# Patient Record
Sex: Female | Born: 1970 | Race: White | Hispanic: Yes | Marital: Single | Smoking: Never smoker
Health system: Southern US, Community
[De-identification: ages and names within clinical notes are randomized; demographics above are authoritative.]

## PROBLEM LIST (undated history)

## (undated) DIAGNOSIS — Z789 Other specified health status: Secondary | ICD-10-CM

## (undated) DIAGNOSIS — E785 Hyperlipidemia, unspecified: Secondary | ICD-10-CM

## (undated) HISTORY — DX: Hyperlipidemia, unspecified: E78.5

## (undated) HISTORY — PX: BREAST ENHANCEMENT SURGERY: SHX7

---

## 2006-10-05 ENCOUNTER — Ambulatory Visit: Payer: Self-pay | Admitting: Internal Medicine

## 2006-10-10 ENCOUNTER — Ambulatory Visit: Payer: Self-pay | Admitting: *Deleted

## 2007-02-28 DIAGNOSIS — R7309 Other abnormal glucose: Secondary | ICD-10-CM | POA: Insufficient documentation

## 2007-08-07 ENCOUNTER — Encounter: Payer: Self-pay | Admitting: Family Medicine

## 2007-08-07 ENCOUNTER — Ambulatory Visit: Payer: Self-pay | Admitting: Internal Medicine

## 2007-09-25 ENCOUNTER — Ambulatory Visit: Payer: Self-pay | Admitting: Internal Medicine

## 2007-09-25 ENCOUNTER — Encounter: Payer: Self-pay | Admitting: Family Medicine

## 2007-09-25 LAB — CONVERTED CEMR LAB
AST: 13 units/L (ref 0–37)
Albumin: 4.6 g/dL (ref 3.5–5.2)
BUN: 11 mg/dL (ref 6–23)
Basophils Relative: 0 % (ref 0–1)
Calcium: 9.4 mg/dL (ref 8.4–10.5)
Chloride: 107 meq/L (ref 96–112)
Creatinine, Ser: 0.61 mg/dL (ref 0.40–1.20)
Eosinophils Absolute: 0.1 10*3/uL (ref 0.0–0.7)
Glucose, Bld: 90 mg/dL (ref 70–99)
HDL: 47 mg/dL (ref >39)
Hemoglobin: 13.9 g/dL (ref 12.0–15.0)
Lymphs Abs: 2.3 10*3/uL (ref 0.7–4.0)
MCHC: 32.3 g/dL (ref 30.0–36.0)
MCV: 93.5 fL (ref 78.0–100.0)
Monocytes Absolute: 0.3 10*3/uL (ref 0.1–1.0)
Monocytes Relative: 4 % (ref 3–12)
Neutro Abs: 5 10*3/uL (ref 1.7–7.7)
RBC: 4.61 M/uL (ref 3.87–5.11)
TSH: 2.002 microintl units/mL (ref 0.350–5.50)
Total CHOL/HDL Ratio: 3.3
Triglycerides: 100 mg/dL (ref <150)
WBC: 7.6 10*3/uL (ref 4.0–10.5)

## 2008-05-24 ENCOUNTER — Ambulatory Visit: Payer: Self-pay | Admitting: Internal Medicine

## 2008-05-31 ENCOUNTER — Ambulatory Visit (HOSPITAL_COMMUNITY): Admission: RE | Admit: 2008-05-31 | Discharge: 2008-05-31 | Payer: Self-pay | Admitting: Family Medicine

## 2008-08-23 ENCOUNTER — Ambulatory Visit: Payer: Self-pay | Admitting: Internal Medicine

## 2009-01-17 ENCOUNTER — Encounter: Payer: Self-pay | Admitting: Family Medicine

## 2009-01-17 ENCOUNTER — Ambulatory Visit: Payer: Self-pay | Admitting: Internal Medicine

## 2009-01-17 LAB — CONVERTED CEMR LAB: GC Probe Amp, Genital: NEGATIVE

## 2009-04-26 HISTORY — PX: AUGMENTATION MAMMAPLASTY: SUR837

## 2011-07-24 ENCOUNTER — Inpatient Hospital Stay (HOSPITAL_COMMUNITY): Payer: Medicaid Other

## 2011-07-24 ENCOUNTER — Encounter (HOSPITAL_COMMUNITY): Payer: Self-pay | Admitting: *Deleted

## 2011-07-24 ENCOUNTER — Inpatient Hospital Stay (HOSPITAL_COMMUNITY)
Admission: AD | Admit: 2011-07-24 | Discharge: 2011-07-25 | Disposition: A | Discharge status: Home / Self Care | Payer: Medicaid Other | Source: Ambulatory Visit | Attending: Obstetrics & Gynecology | Admitting: Obstetrics & Gynecology

## 2011-07-24 DIAGNOSIS — O00109 Unspecified tubal pregnancy without intrauterine pregnancy: Secondary | ICD-10-CM | POA: Insufficient documentation

## 2011-07-24 DIAGNOSIS — O009 Unspecified ectopic pregnancy without intrauterine pregnancy: Secondary | ICD-10-CM

## 2011-07-24 HISTORY — DX: Other specified health status: Z78.9

## 2011-07-24 LAB — CREATININE, SERUM: Creatinine, Ser: 0.75 mg/dL (ref 0.50–1.10)

## 2011-07-24 LAB — CBC
Hemoglobin: 12.5 g/dL (ref 12.0–15.0)
MCHC: 34.2 g/dL (ref 30.0–36.0)
Platelets: 274 10*3/uL (ref 150–400)
RBC: 4.02 MIL/uL (ref 3.87–5.11)

## 2011-07-24 LAB — URINALYSIS, ROUTINE W REFLEX MICROSCOPIC
Bilirubin Urine: NEGATIVE
Glucose, UA: NEGATIVE mg/dL
Ketones, ur: NEGATIVE mg/dL
Specific Gravity, Urine: 1.015 (ref 1.005–1.030)
pH: 6 (ref 5.0–8.0)

## 2011-07-24 LAB — ABO/RH: ABO/RH(D): O POS

## 2011-07-24 LAB — BUN: BUN: 13 mg/dL (ref 6–23)

## 2011-07-24 LAB — URINE MICROSCOPIC-ADD ON

## 2011-07-24 LAB — HCG, QUANTITATIVE, PREGNANCY: hCG, Beta Chain, Quant, S: 985 m[IU]/mL — ABNORMAL HIGH (ref <5)

## 2011-07-24 MED ORDER — METHOTREXATE INJECTION FOR WOMEN'S HOSPITAL
50.0000 mg/m2 | Freq: Once | INTRAMUSCULAR | Status: AC
  Administered 2011-07-25: 90 mg via INTRAMUSCULAR
  Filled 2011-07-24: qty 1.8

## 2011-07-24 NOTE — MAU Provider Note (Signed)
History     CSN: [id number]  Arrival date and time: 07/24/11 2017   None     Chief Complaint  Patient presents with  . Vaginal Bleeding  . Back Pain  . Abdominal Pain   HPI This is a 41 year old G5 P2021 at 5 weeks and 5 days who is seen with lower abdominal cramping that started earlier today. She denies vaginal bleeding, vaginal discharge, fevers, chills, nausea.  OB History    Grav Para Term Preterm Abortions TAB SAB Ect Mult Living   5 2 2  2  2   1       Past Medical History  Diagnosis Date  . No pertinent past medical history     Past Surgical History  Procedure Date  . Breast enhancement surgery     Family History  Problem Relation Age of Onset  . Anesthesia problems Neg Hx     History  Substance Use Topics  . Smoking status: Never Smoker   . Smokeless tobacco: Not on file  . Alcohol Use: No    Allergies: No Known Allergies  Prescriptions prior to admission  Medication Sig Dispense Refill  . acetaminophen (TYLENOL) 500 MG tablet Take 500 mg by mouth every 6 (six) hours as needed. For pain        Review of Systems  All other systems reviewed and are negative.   Physical Exam   Blood pressure 112/68, pulse 92, temperature 98.8 F (37.1 C), temperature source Oral, resp. rate 16, height 5' 3.75" (1.619 m), weight 71.215 kg (157 lb), last menstrual period 06/14/2011.  Physical Exam  Constitutional: She is oriented to person, place, and time. She appears well-developed and well-nourished.  HENT:  Head: Normocephalic and atraumatic.  Cardiovascular: Normal rate.   Respiratory: Effort normal.  GI: Soft. Bowel sounds are normal. She exhibits no distension and no mass. There is tenderness (Mild lower abdominal tenderness). There is no rebound and no guarding.  Musculoskeletal: Normal range of motion.  Neurological: She is alert and oriented to person, place, and time.  Skin: Skin is warm and dry.  Psychiatric: She has a normal mood and affect. Her  behavior is normal. Judgment and thought content normal.   Results for orders placed during the hospital encounter of 07/24/11 (from the past 24 hour(s))  URINALYSIS, ROUTINE W REFLEX MICROSCOPIC     Status: Abnormal   Collection Time   07/24/11  8:38 PM      Component Value Range   Color, Urine YELLOW  YELLOW    APPearance CLEAR  CLEAR    Specific Gravity, Urine 1.015  1.005 - 1.030    pH 6.0  5.0 - 8.0    Glucose, UA NEGATIVE  NEGATIVE (mg/dL)   Hgb urine dipstick TRACE (*) NEGATIVE    Bilirubin Urine NEGATIVE  NEGATIVE    Ketones, ur NEGATIVE  NEGATIVE (mg/dL)   Protein, ur NEGATIVE  NEGATIVE (mg/dL)   Urobilinogen, UA 0.2  0.0 - 1.0 (mg/dL)   Nitrite NEGATIVE  NEGATIVE    Leukocytes, UA NEGATIVE  NEGATIVE   URINE MICROSCOPIC-ADD ON     Status: Normal   Collection Time   07/24/11  8:38 PM      Component Value Range   Squamous Epithelial / LPF RARE  RARE    WBC, UA 0-2  <3 (WBC/hpf)   RBC / HPF 0-2  <3 (RBC/hpf)  POCT PREGNANCY, URINE     Status: Abnormal   Collection Time   07/24/11  8:45  PM      Component Value Range   Preg Test, Ur POSITIVE (*) NEGATIVE   CBC     Status: Normal   Collection Time   07/24/11  9:55 PM      Component Value Range   WBC 9.8  4.0 - 10.5 (K/uL)   RBC 4.02  3.87 - 5.11 (MIL/uL)   Hemoglobin 12.5  12.0 - 15.0 (g/dL)   HCT 16.1  09.6 - 04.5 (%)   MCV 91.0  78.0 - 100.0 (fL)   MCH 31.1  26.0 - 34.0 (pg)   MCHC 34.2  30.0 - 36.0 (g/dL)   RDW 40.9  81.1 - 91.4 (%)   Platelets 274  150 - 400 (K/uL)  ABO/RH     Status: Normal   Collection Time   07/24/11  9:55 PM      Component Value Range   ABO/RH(D) O POS    HCG, QUANTITATIVE, PREGNANCY     Status: Abnormal   Collection Time   07/24/11  9:55 PM      Component Value Range   hCG, Beta Chain, Quant, S 985 (*) <5 (mIU/mL)  AST     Status: Normal   Collection Time   07/24/11  9:55 PM      Component Value Range   AST 13  0 - 37 (U/L)  BUN     Status: Normal   Collection Time   07/24/11  9:55 PM        Component Value Range   BUN 13  6 - 23 (mg/dL)  CREATININE, SERUM     Status: Normal   Collection Time   07/24/11  9:55 PM      Component Value Range   Creatinine, Ser 0.75  0.50 - 1.10 (mg/dL)   GFR calc non Af Amer >90  >90 (mL/min)   GFR calc Af Amer >90  >90 (mL/min)   [place]: Findings: No intrauterine pregnancy is visualized. Multiple  uterine fibroids are noted., the largest measures up to 2.7 cm.  Endometrial thickness 11 mm.  There is a right adnexal mass adjacent to the right ovary measuring  1.5 x 1.0 x 1.0 cm. It has a yolk sac is seen within this cystic  mass. Findings compatible with right adnexal ectopic pregnancy.  No free fluid. Ovaries are symmetric in size and echotexture.  IMPRESSION:  1.5 cm right adnexal ectopic pregnancy. No evidence of rupture.  Multiple uterine fibroids.  MAU Course  Procedures  MDM  Assessment and Plan  1.  Ectopic Pregnancy Will administer methotrexate.  Patient to return in 4 days to MAU for repeat testing.  Naythan Douthit JEHIEL 07/24/2011, 11:51 PM

## 2011-07-24 NOTE — MAU Note (Signed)
Pt reports spotting, abdominal pain and bilateral back pain

## 2011-07-24 NOTE — Discharge Instructions (Signed)
Tratamiento con metotrexato para el embarazo ectpico (Methotrexate Treatment for an Ectopic Pregnancy) El embarazo ectpico se produce cuando el huevo fertilizado se adhiere (implanta) fuera del tero. La mayora de los embarazos ectpicos se producen en la trompa de Falopio. Es raro que ocurran en el ovario, el intestino, la pelvis o el cuello uterino. Un embarazo ectpico no tiene la capacidad de llegar a ser un beb normal y sano. Puede ser una experiencia que pone en peligro la vida. Sin embargo, si se diagnostica precozmente, puede tratarse con un medicamento. Este medicamento es el metotrexato. El metotrexato acta eficientemente impidiendo que el embarazo siga. Ayuda al organismo a absorber el tejido del embarazo en un perodo de 2 a 6 semanas (aunque la mayora de los embarazos sern absorbidos aproximadamente a las 3 semanas).  Si el metotrexato es efectivo, hay una buena probabilidad de que la trompa de Falopio puede salvarse. Independientemente de si la trompa de Falopio puede salvarse, una madre que ha tenido un embarazo ectpico tiene un riesgo mucho mayor de tener otro embarazo ectpico en los embarazos futuros. Una seria preocupacin es el desgarro potencial de la trompa de Falopio (ruptura). Si esto ocurre, es necesario realizar una ciruga de emergencia para remover el embarazo, y el metotrexato no se puede utilizar.  La paciente ideal para recibir el metotrexato es aquella que:   No sufra una hemorragia interna.   No sienta dolor abdominal intenso o persistente.   Se haya comprometido a seguir adelante con las pruebas de laboratorio y concurrir a los controles hasta que el embarazo ectpico se haya absorbido.   Sea sana y tenga las funciones hepticas y renales normales en el momento de la evaluacin.  El metotrexato no debe indicarse en mujeres que:   Estn amamantando.   Tener un embarazo normal (embarazo intrauterino).   Sufrir enfermedades hepticas, pulmonares o renales.     Tienen problemas sanguneos.   Son alrgicas al metotrexato.   Sufren de lcera pptica.   Tener un embarazo ectpico mayor de 1  pulgadas (3,5 cm) o en el que haya latidos cardacos fetales. Esta es una norma que se sigue la mayor parte del tiempo (contraindicacin relativa).  ANTES DEL TRATAMIENTO  Antes de administrar el medicamento:  Le realizarn pruebas hepticas, renales y anlisis de sangre completo.   Los anlisis de sangre se realizan para medir los niveles hormonales de embarazo y para determinar el tipo sanguneo de la madre.   Si la mujer es Rh negativa y el padre es Rh positivo o su Rh es desconocido, le administrarn una inyeccin de RhoGAM.  TRATAMIENTO Hay dos formas que puede indicar el mdico para usar el metotrexato. Una forma consiste en una sola dosis o inyeccin del medicamento. La otra forma consiste en una serie de dosis. Este mtodo implica la aplicacin de varias inyecciones. Los anlisis de sangre se tomarn durante varias semanas para comprobar los niveles de la hormona del embarazo. Los anlisis de sangre se realizan hasta que no se detecta ms hormona del embarazo en la sangre.  DESPUS DEL TRATAMIENTO  Durante algunas semanas le harn anlisis de sangre para controlar los niveles de hormonas del embarazo. Los anlisis se indican hasta que no se detecte ms hormonas del embarazo en la sangre. Tambin existe riesgo de ruptura del embarazo ectpico cuando se utiliza el metotrexato. Este medicamento tambin puede causar efectos adversos, que son:   Nuseas y vmitos.   Llagas en la boca.   Diarrea.   Erupcin.     Mareos.   Mayor dolor abdominal.   Aumento de la hemorragia vaginal.   Neumona.   Fracaso del tratamiento.   Prdida del cabello (raro y reversible).  En muy raras ocasiones puede afectar el recuento de glbulos rojos, el hgado, la mdula o los niveles de hormonas. Si esto sucede, el profesional necesitar realizarle otras  evaluaciones.  Document Released: 01/20/2005 Document Revised: 04/01/2011 [employer] Patient Information 2012 [employer]. 

## 2011-07-24 NOTE — MAU Note (Signed)
Pt states, " I started having pain in my low back on Wed ,and on Thursday when I wiped I had brown spotting and pain in my left and right lower abdomen. All has continued thru today ."

## 2011-07-28 ENCOUNTER — Inpatient Hospital Stay (HOSPITAL_COMMUNITY)
Admission: AD | Admit: 2011-07-28 | Discharge: 2011-07-28 | Disposition: A | Discharge status: Home / Self Care | Payer: Self-pay | Source: Ambulatory Visit | Attending: Obstetrics & Gynecology | Admitting: Obstetrics & Gynecology

## 2011-07-28 DIAGNOSIS — O009 Unspecified ectopic pregnancy without intrauterine pregnancy: Secondary | ICD-10-CM

## 2011-07-28 DIAGNOSIS — O00109 Unspecified tubal pregnancy without intrauterine pregnancy: Secondary | ICD-10-CM | POA: Insufficient documentation

## 2011-07-28 LAB — HCG, QUANTITATIVE, PREGNANCY: hCG, Beta Chain, Quant, S: 1879 m[IU]/mL — ABNORMAL HIGH (ref <5)

## 2011-07-28 NOTE — MAU Note (Signed)
Patient to MAU for follow up BHCG day #4 s/p MTX. Patient state she has no pain but a little spotting. Eda Royal in for translation for triage.

## 2011-07-28 NOTE — MAU Provider Note (Signed)
  History     CSN: [id number]  Arrival date and time: 07/28/11 1358   None     No chief complaint on file.  HPI Yesenia Gibson is 41 y.o. [phone] [redacted]w[redacted]d weeks presents for follow up BHCG -day 4 after receiving Methotrexate for right ectopic pregnancy dx on 3/30 by ultrasound.  BHCG on that date was 985.  She reports spotting today and denies abdominal /pelvic pain,.    Past Medical History  Diagnosis Date  . No pertinent past medical history     Past Surgical History  Procedure Date  . Breast enhancement surgery     Family History  Problem Relation Age of Onset  . Anesthesia problems Neg Hx     History  Substance Use Topics  . Smoking status: Never Smoker   . Smokeless tobacco: Not on file  . Alcohol Use: No    Allergies: No Known Allergies  Prescriptions prior to admission  Medication Sig Dispense Refill  . acetaminophen (TYLENOL) 500 MG tablet Take 500 mg by mouth every 6 (six) hours as needed. For pain        Review of Systems  Genitourinary:       + for spotting, negative for abdominal/pelvic pain   Physical Exam   Last menstrual period 06/14/2011.  Physical Exam  Constitutional: She is oriented to person, place, and time. She appears well-developed and well-nourished.  HENT:  Head: Normocephalic.  Neck: Normal range of motion.  Respiratory: Effort normal.  Genitourinary:       Not indicated  Neurological: She is alert and oriented to person, place, and time.  Skin: Skin is warm and dry.  Psychiatric: She has a normal mood and affect. Her behavior is normal.      Results for orders placed during the hospital encounter of 07/28/11 (from the past 24 hour(s))  HCG, QUANTITATIVE, PREGNANCY     Status: Abnormal   Collection Time   07/28/11  2:20 PM      Component Value Range   hCG, Beta Chain, Quant, S 1879 (*) <5 (mIU/mL)    MAU Course  Procedures  MDM 15:05  Reported to Dr. Debroah Loop the [facility] and U/S from 3/30 as well as spotting  without pain today.  Continue with MTX protocol and return on Day 7 for repeat BHCG or sooner for increased pain or bleeding  Assessment and Plan  A:  Right ectopic pregnancy--day 4 after Methotrexate  P:  With the translator, explained today's results and plan to return on Day 7, which is Saturday, April 6 between 8am-noon for repeat blood work.  She is to return before that date for heavy bleeding or increased pain.  Yanis Juma,EVE M 07/28/2011, 2:04 PM

## 2011-07-28 NOTE — Discharge Instructions (Signed)
Tratamiento con metotrexato para el embarazo ectpico (Methotrexate Treatment for an Ectopic Pregnancy) El embarazo ectpico se produce cuando el huevo fertilizado se adhiere (implanta) fuera del tero. La mayora de los embarazos ectpicos se producen en la trompa de Falopio. Es raro que ocurran en el ovario, el intestino, la pelvis o el cuello uterino. Un embarazo ectpico no tiene la capacidad de llegar a ser un beb normal y sano. Puede ser una experiencia que pone en peligro la vida. Sin embargo, si se diagnostica precozmente, puede tratarse con un medicamento. Este medicamento es el metotrexato. El metotrexato acta eficientemente impidiendo que el embarazo siga. Ayuda al organismo a absorber el tejido del embarazo en un perodo de 2 a 6 semanas (aunque la mayora de los embarazos sern absorbidos aproximadamente a las 3 semanas).  Si el metotrexato es efectivo, hay una buena probabilidad de que la trompa de Falopio puede salvarse. Independientemente de si la trompa de Falopio puede salvarse, una madre que ha tenido un embarazo ectpico tiene un riesgo mucho mayor de tener otro embarazo ectpico en los embarazos futuros. Una seria preocupacin es el desgarro potencial de la trompa de Falopio (ruptura). Si esto ocurre, es necesario realizar una ciruga de emergencia para remover el embarazo, y el metotrexato no se puede utilizar.  La paciente ideal para recibir el metotrexato es aquella que:   No sufra una hemorragia interna.   No sienta dolor abdominal intenso o persistente.   Se haya comprometido a seguir adelante con las pruebas de laboratorio y concurrir a los controles hasta que el embarazo ectpico se haya absorbido.   Sea sana y tenga las funciones hepticas y renales normales en el momento de la evaluacin.  El metotrexato no debe indicarse en mujeres que:   Estn amamantando.   Tener un embarazo normal (embarazo intrauterino).   Sufrir enfermedades hepticas, pulmonares o renales.     Tienen problemas sanguneos.   Son alrgicas al metotrexato.   Sufren de lcera pptica.   Tener un embarazo ectpico mayor de 1  pulgadas (3,5 cm) o en el que haya latidos cardacos fetales. Esta es una norma que se sigue la mayor parte del tiempo (contraindicacin relativa).  ANTES DEL TRATAMIENTO  Antes de administrar el medicamento:  Le realizarn pruebas hepticas, renales y anlisis de sangre completo.   Los anlisis de sangre se realizan para medir los niveles hormonales de embarazo y para determinar el tipo sanguneo de la madre.   Si la mujer es Rh negativa y el padre es Rh positivo o su Rh es desconocido, le administrarn una inyeccin de RhoGAM.  TRATAMIENTO Hay dos formas que puede indicar el mdico para usar el metotrexato. Una forma consiste en una sola dosis o inyeccin del medicamento. La otra forma consiste en una serie de dosis. Este mtodo implica la aplicacin de varias inyecciones. Los anlisis de sangre se tomarn durante varias semanas para comprobar los niveles de la hormona del embarazo. Los anlisis de sangre se realizan hasta que no se detecta ms hormona del embarazo en la sangre.  DESPUS DEL TRATAMIENTO  Durante algunas semanas le harn anlisis de sangre para controlar los niveles de hormonas del embarazo. Los anlisis se indican hasta que no se detecte ms hormonas del embarazo en la sangre. Tambin existe riesgo de ruptura del embarazo ectpico cuando se utiliza el metotrexato. Este medicamento tambin puede causar efectos adversos, que son:   Nuseas y vmitos.   Llagas en la boca.   Diarrea.   Erupcin.     Mareos.   Mayor dolor abdominal.   Aumento de la hemorragia vaginal.   Neumona.   Fracaso del tratamiento.   Prdida del cabello (raro y reversible).  En muy raras ocasiones puede afectar el recuento de glbulos rojos, el hgado, la mdula o los niveles de hormonas. Si esto sucede, el profesional necesitar realizarle otras  evaluaciones.  Document Released: 01/20/2005 Document Revised: 04/01/2011 [employer] Patient Information 2012 [employer]. 

## 2011-07-31 ENCOUNTER — Encounter (HOSPITAL_COMMUNITY): Payer: Self-pay | Admitting: Family

## 2011-07-31 ENCOUNTER — Inpatient Hospital Stay (HOSPITAL_COMMUNITY)
Admission: AD | Admit: 2011-07-31 | Discharge: 2011-07-31 | Disposition: A | Discharge status: Home / Self Care | Payer: Self-pay | Source: Ambulatory Visit | Attending: Obstetrics & Gynecology | Admitting: Obstetrics & Gynecology

## 2011-07-31 DIAGNOSIS — O00109 Unspecified tubal pregnancy without intrauterine pregnancy: Secondary | ICD-10-CM | POA: Insufficient documentation

## 2011-07-31 DIAGNOSIS — O009 Unspecified ectopic pregnancy without intrauterine pregnancy: Secondary | ICD-10-CM

## 2011-07-31 LAB — HCG, QUANTITATIVE, PREGNANCY: hCG, Beta Chain, Quant, S: 1490 m[IU]/mL — ABNORMAL HIGH (ref <5)

## 2011-07-31 NOTE — ED Provider Notes (Signed)
S:  Pt here for Beta HCG;  Here day 7 post MTX.  Pt denies vaginal bleeding.  Report left sided pelvic with sitting; pain is not increasing.  BHCG results for past 7 days are:      Ref. Range 07/24/2011 20:45 07/24/2011 21:55 07/24/2011 22:39 07/28/2011 14:20 07/31/2011 10:51  hCG, Beta Chain, Quant, S Latest Range: <5 mIU/mL  985 (H)  1879 (H) 1490 (H)   O: BP 112/71  Pulse 80  Temp 98.3 F (36.8 C)  Resp 18  LMP 06/14/2011  A&OX3; No signs of acute distress  A:  Follow-up BHCG  P: BHCG had an appropriate drop (approx. 19%) Ectopic precautions F/U weekly for BHCG check until non-pregnant levels.  [facility]

## 2011-07-31 NOTE — ED Provider Notes (Signed)
Attestation of Attending Supervision of Advanced Practitioner: Evaluation and management procedures were performed by the [facility] Fellow/PA/CNM/NP under my supervision and collaboration. Chart reviewed, and agree with management and plan.  Jaynie Collins, M.D. 07/31/2011 7:07 PM

## 2011-07-31 NOTE — MAU Note (Signed)
Pt here for follow up BHCG deneis pain or vaginal bleeding at this time

## 2011-08-07 ENCOUNTER — Inpatient Hospital Stay (HOSPITAL_COMMUNITY)
Admission: AD | Admit: 2011-08-07 | Discharge: 2011-08-07 | Disposition: A | Discharge status: Home / Self Care | Payer: Self-pay | Source: Ambulatory Visit | Attending: Obstetrics & Gynecology | Admitting: Obstetrics & Gynecology

## 2011-08-07 DIAGNOSIS — Z09 Encounter for follow-up examination after completed treatment for conditions other than malignant neoplasm: Secondary | ICD-10-CM

## 2011-08-07 DIAGNOSIS — O00109 Unspecified tubal pregnancy without intrauterine pregnancy: Secondary | ICD-10-CM | POA: Insufficient documentation

## 2011-08-07 LAB — HCG, QUANTITATIVE, PREGNANCY: hCG, Beta Chain, Quant, S: 1225 m[IU]/mL — ABNORMAL HIGH

## 2011-08-07 NOTE — MAU Note (Signed)
Patient here for weekly BHCG s/p methotrexate,

## 2011-08-07 NOTE — MAU Provider Note (Signed)
  History     CSN: [id number]  Arrival date and time: 08/07/11 1014   First Provider Initiated Contact with Patient 08/07/11 1114      Chief Complaint  Patient presents with  . Vaginal Bleeding  . Abdominal Cramping   HPI Yesenia Gibson 41 y.o. comes to MAU for weekly followup quants after methotrexate on 3/30-13 for ectopic pregnancy.  OB History    Grav Para Term Preterm Abortions TAB SAB Ect Mult Living   5 2 2  2  2   1       Past Medical History  Diagnosis Date  . No pertinent past medical history     Past Surgical History  Procedure Date  . Breast enhancement surgery     Family History  Problem Relation Age of Onset  . Anesthesia problems Neg Hx     History  Substance Use Topics  . Smoking status: Never Smoker   . Smokeless tobacco: Not on file  . Alcohol Use: No    Allergies: No Known Allergies  No prescriptions prior to admission    Review of Systems  Gastrointestinal: Negative for abdominal pain.  Genitourinary:       Vaginal bleeding   Physical Exam   Blood pressure 108/71, pulse 77, temperature 97.8 F (36.6 C), temperature source Oral, resp. rate 16, last menstrual period 06/14/2011.  Physical Exam  Nursing note and vitals reviewed. Constitutional: She is oriented to person, place, and time. She appears well-developed and well-nourished.  HENT:  Head: Normocephalic.  Eyes: EOM are normal.  Neck: Neck supple.  Musculoskeletal: Normal range of motion.  Neurological: She is alert and oriented to person, place, and time.  Skin: Skin is warm and dry.  Psychiatric: She has a normal mood and affect.    MAU Course  Procedures Results for Yesenia Gibson (MRN [id number]) as of 08/07/2011 11:11  Ref. Range 07/28/2011 14:20 07/29/2011 02:10 07/31/2011 10:51 08/01/2011 02:10 08/07/2011 10:20  hCG, Beta Chain, Quant, S Latest Range: <5 mIU/mL 1879 (H)  1490 (H)  1225 (H)   Quant was 985 on 07-24-11  MDM Discussed option of GYN  clinic for weekly labs.  Client wants to continue at MAU on Saturdays. Spanish interpreter here for discussion.  Client had an number of questions about continuing to return for follow up.  Assessment and Plan  Follow up for ectopic pregnancy with methotrexate  Plan Continue to return weekly until levels return to zerol Note to return to work tomorrow given at [employer] request - would like a note at each visit.  Instructed to ask each time.  Yesenia Gibson 08/07/2011, 11:30 AM

## 2011-08-07 NOTE — MAU Note (Signed)
On Wednesday started having cramping and bleeding today lighter and less pain.

## 2011-08-14 ENCOUNTER — Inpatient Hospital Stay (HOSPITAL_COMMUNITY)
Admission: AD | Admit: 2011-08-14 | Discharge: 2011-08-14 | Disposition: A | Discharge status: Home / Self Care | Payer: Self-pay | Source: Ambulatory Visit | Attending: Obstetrics & Gynecology | Admitting: Obstetrics & Gynecology

## 2011-08-14 DIAGNOSIS — O009 Unspecified ectopic pregnancy without intrauterine pregnancy: Secondary | ICD-10-CM

## 2011-08-14 DIAGNOSIS — O00109 Unspecified tubal pregnancy without intrauterine pregnancy: Secondary | ICD-10-CM | POA: Insufficient documentation

## 2011-08-14 LAB — HCG, QUANTITATIVE, PREGNANCY: hCG, Beta Chain, Quant, S: 250 m[IU]/mL — ABNORMAL HIGH (ref <5)

## 2011-08-14 NOTE — MAU Provider Note (Signed)
History     CSN: [id number]  Arrival date & time 08/14/11  1113   None     Chief Complaint  Patient presents with  . Ectopic Pregnancy  . Vaginal Bleeding    (Consider location/radiation/quality/duration/timing/severity/associated sxs/prior treatment) HPIMigdalia ASTACIO Gibson  Is a 41 y.o. No obstetric history on file. He is here for f/u BHCG. 1st MAU visit was 3/30 for abd pain. BHCG was 985, U/S revealed R adnexal mass with yolk sac measuring 1.5 x 1 x 1 cm. She also had 2.7 cm fibroid. She was given MTX on that day. Pt returned 4/3, BHCG was 1879, day 7 (4/6) was 1490 . On 4/13 BHCG was 1225. Today pt has no pain, small amt spotting.  No past medical history on file.  No past surgical history on file.  No family history on file.  History  Substance Use Topics  . Smoking status: Not on file  . Smokeless tobacco: Not on file  . Alcohol Use: Not on file    OB History    No data available      Review of Systems  Constitutional: Negative for fever and chills.  Genitourinary: Positive for vaginal bleeding.  Neurological: Negative for dizziness and syncope.    Allergies  Review of patient's allergies indicates not on file.  Home Medications  No current outpatient prescriptions on file.  BP 103/72  Pulse 79  Temp(Src) 97.3 F (36.3 C) (Oral)  Resp 16  Physical Exam  Constitutional: She appears well-developed and well-nourished.  Musculoskeletal: Normal range of motion.  Neurological: She is alert.  Psychiatric: She has a normal mood and affect.    ED Course  Procedures (including critical care time)  Labs Reviewed  HCG, QUANTITATIVE, PREGNANCY - Abnormal; Notable for the following:    hCG, Beta Chain, Quant, S 250 (*)    All other components within normal limits   No results found. ASSESSMENT: Ectopic pregnancy treated with MTX with falling BHCG's  No diagnosis found. PLAN:  Return 1 wk 4/27 for next BHCG.  Ectopic precautions reviewed Note  for missing work today.   MDM

## 2011-08-14 NOTE — Discharge Instructions (Signed)
Tratamiento con metotrexato para Firefighter ectpico (Methotrexate Treatment for an Ectopic Pregnancy) El Multimedia programmer se produce cuando el huevo fertilizado se adhiere (implanta) fuera del tero. La mayora de los embarazos ectpicos se producen en la trompa de [place]. Es raro que ocurran en el ovario, el intestino, la pelvis o el cuello uterino. Un embarazo ectpico no tiene la capacidad de llegar a ser un beb normal y sano. Puede ser Neomia Dear experiencia que pone en peligro la vida. Sin embargo, si se diagnostica precozmente, puede tratarse con un medicamento. Este medicamento es Air cabin crew. El metotrexato acta eficientemente impidiendo que el embarazo siga. Ayuda al organismo a absorber el tejido del Psychiatrist en un perodo de 2 a 6 semanas (aunque la mayora de los embarazos sern absorbidos aproximadamente a las 3 semanas).  Si el metotrexato es [place], hay una buena probabilidad de que la trompa de Falopio puede salvarse. Independientemente de si la trompa de Falopio puede salvarse, una madre que ha tenido un Psychiatrist ectpico tiene un riesgo mucho mayor de Warehouse manager otro embarazo ectpico en los embarazos futuros. Neomia Dear seria preocupacin es el desgarro potencial de la trompa de Falopio (ruptura). Si esto ocurre, es necesario realizar [place] de emergencia para Office manager, y el metotrexato no se puede Chemical engineer.  La paciente ideal para recibir Air cabin crew es aquella que:   No sufra una hemorragia interna.   No sienta dolor abdominal intenso o persistente.   Se haya comprometido a seguir adelante con las pruebas de laboratorio y Water quality scientist a los controles hasta que el embarazo ectpico se haya absorbido.   Sea sana y tenga las funciones hepticas y renales normales en el momento de la evaluacin.  El metotrexato no debe indicarse en mujeres que:   Estn amamantando.   Tener un embarazo normal (embarazo intrauterino).   Sufrir enfermedades hepticas, pulmonares o renales.     Tienen problemas sanguneos.   Son alrgicas al [employer].   Sufren de Office manager.   Tener un embarazo ectpico mayor de 1  pulgadas (3,5 cm) o en el que haya latidos cardacos fetales. Esta es una norma que se sigue la mayor parte del tiempo (contraindicacin [place]).  ANTES DEL TRATAMIENTO  Antes de administrar el medicamento:  Le realizarn pruebas hepticas, renales y anlisis de sangre completo.   [employer] de sangre se Radiographer, therapeutic para [employer] niveles hormonales de Psychiatrist y para Warehouse manager tipo sanguneo de la [place].   Si la mujer es Rh negativa y el padre es Rh positivo o su Rh es desconocido, le administrarn una inyeccin de RhoGAM.  TRATAMIENTO Hay dos formas que puede indicar el mdico para usar Air cabin crew. Una forma consiste en una sola dosis o inyeccin del medicamento. La otra forma consiste en una serie de dosis. Este mtodo implica la aplicacin de varias inyecciones. [employer] de sangre se tomarn durante varias semanas para [employer] niveles de la hormona del [place]. [employer] de sangre se Programme researcher, broadcasting/film/video que no se detecta ms hormona del Arts development officer.  DESPUS DEL TRATAMIENTO  Durante algunas semanas le harn anlisis de sangre para controlar los niveles de hormonas del [place]. Los anlisis se indican hasta que no se detecte ms hormonas del Arts development officer. Tambin existe riesgo de ruptura del embarazo ectpico cuando se Engineer, materials. Este medicamento tambin puede causar [place], que son:   Elliot Cousin y vmitos.   Llagas en la boca.   Diarrea.   Erupcin.  Mareos.   Mayor dolor abdominal.   Aumento de la hemorragia vaginal.   Neumona.   Rayna Sexton del tratamiento.   Prdida del cabello (raro y reversible).  En muy raras ocasiones puede afectar el recuento de glbulos rojos, el hgado, la mdula o los niveles de hormonas. Si esto sucede, Administrator, Civil Service otras  evaluaciones.  Document Released: 01/20/2005 Document Revised: 04/01/2011 [facility] Patient Information 2012 [place], [place].

## 2011-08-14 NOTE — MAU Note (Signed)
Patient here for weekly BHCG still spotting no pain.

## 2011-08-14 NOTE — MAU Note (Signed)
Call spanish interpreter for assistance with translation.

## 2014-02-25 ENCOUNTER — Encounter (HOSPITAL_COMMUNITY): Payer: Self-pay | Admitting: Family

## 2016-07-20 ENCOUNTER — Other Ambulatory Visit: Payer: Self-pay | Admitting: Nurse Practitioner

## 2016-07-20 DIAGNOSIS — Z1231 Encounter for screening mammogram for malignant neoplasm of breast: Secondary | ICD-10-CM

## 2016-08-09 ENCOUNTER — Ambulatory Visit
Admission: RE | Admit: 2016-08-09 | Discharge: 2016-08-09 | Disposition: A | Discharge status: Home / Self Care | Payer: 59 | Source: Ambulatory Visit | Attending: Nurse Practitioner | Admitting: Nurse Practitioner

## 2016-08-09 DIAGNOSIS — Z1231 Encounter for screening mammogram for malignant neoplasm of breast: Secondary | ICD-10-CM

## 2016-09-07 ENCOUNTER — Encounter: Payer: Self-pay | Admitting: Internal Medicine

## 2016-09-07 ENCOUNTER — Ambulatory Visit: Payer: 59 | Attending: Internal Medicine | Admitting: Internal Medicine

## 2016-09-07 VITALS — BP 103/71 | HR 78 | Temp 98.1°F | Resp 16 | Ht 64.0 in | Wt 165.6 lb

## 2016-09-07 DIAGNOSIS — Z131 Encounter for screening for diabetes mellitus: Secondary | ICD-10-CM

## 2016-09-07 DIAGNOSIS — R739 Hyperglycemia, unspecified: Secondary | ICD-10-CM | POA: Diagnosis not present

## 2016-09-07 DIAGNOSIS — F419 Anxiety disorder, unspecified: Secondary | ICD-10-CM | POA: Diagnosis not present

## 2016-09-07 DIAGNOSIS — E875 Hyperkalemia: Secondary | ICD-10-CM

## 2016-09-07 DIAGNOSIS — J301 Allergic rhinitis due to pollen: Secondary | ICD-10-CM | POA: Insufficient documentation

## 2016-09-07 LAB — GLUCOSE, POCT (MANUAL RESULT ENTRY): POC Glucose: 92 mg/dl (ref 70–99)

## 2016-09-07 LAB — POCT GLYCOSYLATED HEMOGLOBIN (HGB A1C): Hemoglobin A1C: 5.3

## 2016-09-07 MED ORDER — FLUTICASONE PROPIONATE 50 MCG/ACT NA SUSP: 1.0000 | Freq: Two times a day (BID) | NASAL | 2 refills | Status: DC

## 2016-09-07 MED ORDER — ALBUTEROL SULFATE HFA 108 (90 BASE) MCG/ACT IN AERS: 2.0000 | INHALATION_SPRAY | Freq: Four times a day (QID) | RESPIRATORY_TRACT | 3 refills | Status: DC | PRN

## 2016-09-07 MED ORDER — LORATADINE-PSEUDOEPHEDRINE ER 10-240 MG PO TB24: 1.0000 | ORAL_TABLET | Freq: Every day | ORAL | 1 refills | Status: DC

## 2016-09-07 MED FILL — FLUTICASONE PROP 50 MCG SPR: 50 | 30 days supply | Qty: 16 | Fill #0

## 2016-09-07 NOTE — Patient Instructions (Signed)
Rinitis alrgica (Allergic Rhinitis) La rinitis alrgica ocurre cuando las membranas mucosas de la nariz responden a los alrgenos. Los alrgenos son las partculas que estn en el aire y que hacen que el cuerpo tenga una reaccin Counselling psychologist. Esto hace que usted libere anticuerpos alrgicos. A travs de una cadena de eventos, estos finalmente hacen que usted libere histamina en la corriente sangunea. Aunque la funcin de la histamina es proteger al organismo, es esta liberacin de histamina lo que provoca malestar, como los estornudos frecuentes, la congestin y goteo y Control and instrumentation engineer. CAUSAS La causa de la rinitis Merchandiser, retail (fiebre del heno) son los alrgenos del polen que pueden provenir del csped, los rboles y Theme park manager. La causa de la rinitis IT consultant (rinitis alrgica perenne) son los alrgenos, como los caros del polvo domstico, la caspa de las mascotas y las esporas del moho. SNTOMAS  Secrecin nasal (congestin).  Goteo y picazn nasales con estornudos y Arboriculturist. DIAGNSTICO Su mdico puede ayudarlo a Warehouse manager alrgeno o los alrgenos que desencadenan sus sntomas. Si usted y su mdico no pueden Chief Strategy Officer cul es el alrgeno, pueden hacerse anlisis de sangre o estudios de la piel. El mdico diagnosticar la afeccin despus de hacerle una historia clnica y un examen fsico. Adems, puede evaluarlo para detectar la presencia de otras enfermedades afines, como asma, conjuntivitis u otitis. TRATAMIENTO La rinitis alrgica no tiene [place], pero puede controlarse con lo siguiente:  Medicamentos que [employer] sntomas de [place], por ejemplo, vacunas contra la [place], aerosoles nasales y antihistamnicos por va oral.  Evitar el alrgeno. La fiebre del heno a menudo puede tratarse con antihistamnicos en las formas de pldoras o aerosol nasal. Los antihistamnicos bloquean los efectos de la histamina. Existen medicamentos de venta libre que pueden ayudar con la  congestin nasal y la hinchazn alrededor de los ojos. Consulte a su mdico antes de tomar o administrarse este medicamento. Si la prevencin del alrgeno o el medicamento recetado no dan resultado, existen muchos medicamentos nuevos que su mdico puede recetarle. Pueden usarse medicamentos ms fuertes si las medidas iniciales no son efectivas. Pueden aplicarse inyecciones desensibilizantes si los medicamentos y la prevencin no funcionan. La desensibilizacin ocurre cuando un paciente recibe vacunas constantes hasta que el cuerpo se vuelve menos sensible al alrgeno. Asegrese de Medical sales representative seguimiento con su mdico si los problemas continan. INSTRUCCIONES PARA EL CUIDADO EN EL HOGAR No es posible evitar por completo los alrgenos, pero puede reducir los sntomas al tomar medidas para limitar su exposicin a ellos. Es muy til saber exactamente a qu es alrgico para que pueda evitar sus desencadenantes especficos. SOLICITE ATENCIN MDICA SI:  Lance Muss.  Desarrolla una tos que no cesa fcilmente (persistente).  Le falta el aire.  Comienza a tener sibilancias.  Los sntomas interfieren con las actividades diarias normales. Esta informacin no tiene Theme park manager el consejo del mdico. Asegrese de hacerle al mdico cualquier pregunta que tenga. Document Released: 01/20/2005 Document Revised: 05/03/2014 Document Reviewed: 12/18/2012 Elsevier Interactive Patient Education  2017 Elsevier Inc.   Trastorno de ansiedad generalizada (Generalized Anxiety Disorder) El trastorno de ansiedad generalizada es un trastorno mental. Interfiere en las funciones vitales, incluyendo las [place], el trabajo y la escuela.  Es diferente de la ansiedad normal que todas las personas experimentan en algn momento de su vida en respuesta a sucesos y Chief Operating Officer. En verdad, la ansiedad normal nos ayuda a prepararnos y Human resources officer acontecimientos y actividades de la vida. La ansiedad  normal desaparece despus  de que el evento o la actividad ha finalizado.  El trastorno de ansiedad generalizada no est necesariamente relacionada con eventos o actividades especficas. Tambin causa un exceso de ansiedad en proporcin a sucesos o actividades especficas. En este trastorno la ansiedad es difcil de Chief Operating Officercontrolar. Los sntomas pueden variar de leves a muy graves. Las personas que sufren de trastorno de ansiedad generalizada pueden tener intensas olas de ansiedad con sntomas fsicos (ataques de pnico).  SNTOMAS  La ansiedad y la preocupacin asociada a este trastorno son difciles de Chief Operating Officercontrolar. Esta ansiedad y la preocupacin estn relacionados con muchos eventos de la vida y sus actividades y tambin ocurre durante ms [employer] de los que no ocurre, durante 6 meses o ms. Las personas que la sufren pueden tener tres o ms de los siguientes sntomas (uno o ms en los nios):   Glass blower/designerAgitacin   Fatiga.  Dificultades de concentracin.   Irritabilidad.  Tensin muscular  Dificultad para dormirse o sueo poco satisfactorio. DIAGNSTICO  Se diagnostica a travs de una evaluacin realizada por el mdico. El mdico le har preguntas acerca de su estado de nimo, sntomas fsicos y sucesos de Oregonsu vida. Le har preguntas sobre su historia clnica, el consumo de alcohol o drogas, incluyendo los medicamentos recetados. Nucor Corporationambin le har un examen fsico e indicar anlisis de Delansonsangre. Ciertas enfermedades y el uso de determinadas sustancias pueden causar sntomas similares a este trastorno. Su mdico lo puede derivar a Music therapistun especialista en salud mental para una evaluacin ms profunda.Gerlean Ren.  TRATAMIENTO  Las terapias siguientes se utilizan en el tratamiento de este trastorno:   Medicamentos - Se recetan antidepresivos para el control diario a Air cabin crewlargo plazo. Pueden indicarse tambin medicamentos para combatir la Cox Communicationsansiedad en los casos graves, especialmente cuando ocurren ataques de pnico.   Terapia conversada  (psicoterapia) Ciertos tipos de psicoterapia pueden ser tiles en el tratamiento del trastorno de ansiedad generalizada, proporcionando apoyo, educacin y Optometristorientacin. Una forma de psicoterapia llamada terapia cognitivo-conductual puede ensearle formas saludables de pensar y Publishing rights managerreaccionar a los eventos y actividades de la vida diaria.  Tcnicasde manejo del estrs- Estas tcnicas incluyen el yoga, la meditacin y el ejercicio y pueden ser muy tiles cuando se practican con regularidad. Un especialista en salud mental puede ayudar a determinar qu tratamiento es mejor para usted. Algunas personas obtienen mejora con una terapia. Sin embargo, Economistotras personas requieren una combinacin de terapias.  Esta informacin no tiene Theme park managercomo fin reemplazar el consejo del mdico. Asegrese de hacerle al mdico cualquier pregunta que tenga. Document Released: 08/07/2012 Document Revised: 05/03/2014 Elsevier Interactive Patient Education  2017 [employer]

## 2016-09-07 NOTE — Progress Notes (Signed)
Patient ID: Yesenia Gibson, female    DOB: 11/15/1970  MRN: [id number]  CC: Establish Care   Subjective: Yesenia Gibson is a 46 y.o. female who presents for new pt visit. No chronic med problems. Her concerns today include:  1.  C/o problem with pollen x 2-3 mths - "I can't breath, feel like I can't breath and I panic." -no itchy eyes/throat/sneezing. + spasms of LT upper eye lid -+ nasal/sinus congestion - "I can't breath, it's blocked." -using OTC Claritin, Flonase once a day , and an over the counter eye drop BID. They help during the day but not at night. No cough or wheezing.  Wakes up in panic feeling like she can not breath through nose and mouth.  -[place] to [place] in March -no LE edema  Patient Active Problem List   Diagnosis Date Noted  . Seasonal allergic rhinitis due to pollen 09/07/2016  . Anxiety disorder 09/07/2016  . HYPERGLYCEMIA 02/28/2007     Current Outpatient Prescriptions on File Prior to Visit  Medication Sig Dispense Refill  . acetaminophen (TYLENOL) 500 MG tablet Take 500 mg by mouth every 6 (six) hours as needed. For pain     No current facility-administered medications on file prior to visit.     No Known Allergies  Social History   Social History  . Marital status: Single    Spouse name: N/A  . Number of children: 2  . Years of education: 9   Occupational History  . house keeping    Social History Main Topics  . Smoking status: Never Smoker  . Smokeless tobacco: Never Used  . Alcohol use No  . Drug use: No  . Sexual activity: Yes    Birth control/ protection: None   Other Topics Concern  . Not on file   Social History Narrative  . No narrative on file    Family History  Problem Relation Age of Onset  . Anesthesia problems Neg Hx   . Breast cancer Neg Hx     Past Surgical History:  Procedure Laterality Date  . AUGMENTATION MAMMAPLASTY Bilateral 2011   saline implants  . BREAST ENHANCEMENT SURGERY      ROS: Review  of Systems  Constitutional: Negative for fever.  Respiratory: Positive for chest tightness and shortness of breath. Negative for cough.   Cardiovascular: Positive for palpitations. Negative for chest pain and leg swelling.  Neurological: Negative for dizziness, tremors and syncope.    PHYSICAL EXAM: BP 103/71 (BP Location: Left Arm, Patient Position: Sitting, Cuff Size: Large)   Pulse 78   Temp 98.1 F (36.7 C) (Oral)   Resp 16   Ht 5\' 4"  (1.626 m)   Wt 165 lb 9.6 oz (75.1 kg)   LMP 09/02/2016 (Exact Date)   SpO2 97%   BMI 28.43 kg/m   General appearance - alert, well appearing, middle age female and in no distress Mental status - normal mood, behavior, speech, dress, motor activity, and thought processes Eyes - pupils equal and reactive, extraocular eye movements intact, sclera anicteric Nose - mucosal congestion and mucosal pallor Mouth - mucous membranes moist, pharynx normal without lesions Neck - supple, no significant adenopathy.  No thyroid enlargement or nodules Chest - clear to auscultation, no wheezes, rales or rhonchi, symmetric air entry Heart - normal rate, regular rhythm, normal S1, S2, no murmurs, rubs, clicks or gallops Extremities - peripheral pulses normal, no pedal edema, no clubbing or cyanosis  Results for orders placed or performed  in visit on 09/07/16  POCT glycosylated hemoglobin (Hb A1C)  Result Value Ref Range   Hemoglobin A1C 5.3   POCT glucose (manual entry)  Result Value Ref Range   POC Glucose 92 70 - 99 mg/dl    GAD 7 : Generalized Anxiety Score 09/07/2016  Nervous, Anxious, on Edge 2  Control/stop worrying 2  Worry too much - different things 2  Trouble relaxing 2  Restless 2  Easily annoyed or irritable 2  Afraid - awful might happen 2  Total GAD 7 Score 14  Anxiety Difficulty Somewhat difficult   Depression screen [facility] 2/9 09/07/2016  Decreased Interest 0  Down, Depressed, Hopeless 0  PHQ - 2 Score 0  Altered sleeping 0  Tired,  decreased energy 0  Change in appetite 0  Feeling bad or failure about yourself  0  Trouble concentrating 0  Moving slowly or fidgety/restless 0  Suicidal thoughts 0  PHQ-9 Score 0  Difficult doing work/chores Not difficult at all      ASSESSMENT AND PLAN: 1. Seasonal allergic rhinitis due to pollen -change to Claritin D, change Flonase to 1 spray BID and add Albuterol to see if will help with noctural symptoms.  I suspect however, night time symptoms due to anxiety rather than asthma - albuterol (PROVENTIL HFA;VENTOLIN HFA) 108 (90 Base) MCG/ACT inhaler; Inhale 2 puffs into the lungs every 6 (six) hours as needed for shortness of breath.  Dispense: 1 Inhaler; Refill: 3 - fluticasone (FLONASE) 50 MCG/ACT nasal spray; Place 1 spray into both nostrils 2 (two) times daily.  Dispense: 16 g; Refill: 2 - loratadine-pseudoephedrine (CLARITIN-D 24 HOUR) 10-240 MG 24 hr tablet; Take 1 tablet by mouth daily.  Dispense: 30 tablet; Refill: 1  2. Anxiety disorder, unspecified type -if noctural symptoms do not improve with measures above, will start Lexapro on f/u visit - TSH - CBC - Comprehensive metabolic panel  3. Diabetes mellitus screening -normal - POCT glycosylated hemoglobin (Hb A1C) - POCT glucose (manual entry)   Patient was given the opportunity to ask questions.  Patient verbalized understanding of the plan and was able to repeat key elements of the plan.  [employer] interpreter used during this encounter.   Orders Placed This Encounter  Procedures  . TSH  . CBC  . Comprehensive metabolic panel  . POCT glycosylated hemoglobin (Hb A1C)  . POCT glucose (manual entry)     Requested Prescriptions   Signed Prescriptions Disp Refills  . albuterol (PROVENTIL HFA;VENTOLIN HFA) 108 (90 Base) MCG/ACT inhaler 1 Inhaler 3    Sig: Inhale 2 puffs into the lungs every 6 (six) hours as needed for shortness of breath.  . fluticasone (FLONASE) 50 MCG/ACT nasal spray 16 g 2    Sig: Place 1  spray into both nostrils 2 (two) times daily.  Marland Kitchen loratadine-pseudoephedrine (CLARITIN-D 24 HOUR) 10-240 MG 24 hr tablet 30 tablet 1    Sig: Take 1 tablet by mouth daily.    Return in about 3 weeks (around 09/28/2016).  Jonah Blue, MD, FACP

## 2016-09-08 ENCOUNTER — Ambulatory Visit: Payer: 59 | Attending: Internal Medicine

## 2016-09-08 DIAGNOSIS — E875 Hyperkalemia: Secondary | ICD-10-CM

## 2016-09-08 LAB — COMPREHENSIVE METABOLIC PANEL
A/G RATIO: 1.4 (ref 1.2–2.2)
ALK PHOS: 69 IU/L (ref 39–117)
ALT: 19 IU/L (ref 0–32)
AST: 22 IU/L (ref 0–40)
Albumin: 4.2 g/dL (ref 3.5–5.5)
BILIRUBIN TOTAL: 0.2 mg/dL (ref 0.0–1.2)
BUN/Creatinine Ratio: 19 (ref 9–23)
BUN: 12 mg/dL (ref 6–24)
CALCIUM: 9.3 mg/dL (ref 8.7–10.2)
CO2: 21 mmol/L (ref 18–29)
Chloride: 104 mmol/L (ref 96–106)
Creatinine, Ser: 0.64 mg/dL (ref 0.57–1.00)
GFR calc Af Amer: 126 mL/min/{1.73_m2} (ref >59)
GFR, EST NON AFRICAN AMERICAN: 110 mL/min/{1.73_m2} (ref >59)
GLOBULIN, TOTAL: 2.9 g/dL (ref 1.5–4.5)
GLUCOSE: 86 mg/dL (ref 65–99)
POTASSIUM: 5.5 mmol/L — AB (ref 3.5–5.2)
SODIUM: 139 mmol/L (ref 134–144)
Total Protein: 7.1 g/dL (ref 6.0–8.5)

## 2016-09-08 LAB — CBC
HEMOGLOBIN: 12.8 g/dL (ref 11.1–15.9)
Hematocrit: 39.3 % (ref 34.0–46.6)
MCH: 29.9 pg (ref 26.6–33.0)
MCHC: 32.6 g/dL (ref 31.5–35.7)
MCV: 92 fL (ref 79–97)
Platelets: 313 10*3/uL (ref 150–379)
RBC: 4.28 x10E6/uL (ref 3.77–5.28)
RDW: 13.5 % (ref 12.3–15.4)
WBC: 5.2 10*3/uL (ref 3.4–10.8)

## 2016-09-08 LAB — TSH: TSH: 5.1 u[IU]/mL — ABNORMAL HIGH (ref 0.450–4.500)

## 2016-09-08 NOTE — Progress Notes (Signed)
Patient here for lab visit only 

## 2016-09-08 NOTE — Addendum Note (Signed)
Addended by: Jonah BlueJOHNSON, Damiya Sandefur B on: 09/08/2016 12:49 PM   Modules accepted: Orders

## 2016-09-08 NOTE — Progress Notes (Signed)
Patient ID: Yesenia PanningMigdalia Tischer, female   DOB: 10/03/1970, 46 y.o.   MRN: [id number] Phone call placed to pt today using [employer] Marisue Ivan(Liz [phone]).  Pt informed that labs from recent visit showed elevated K+ level and mild elevation in thyroid level.  Discuss things that can cause high K+ including lab error if blood sits for too long in lab, increase intake of K+ rich foods and certain meds.  I recommend return to lab this week for repeat check. Pt agreeable to doing so. Order placed in system.  Results for orders placed or performed in visit on 09/07/16  TSH  Result Value Ref Range   TSH 5.100 (H) 0.450 - 4.500 uIU/mL  CBC  Result Value Ref Range   WBC 5.2 3.4 - 10.8 x10E3/uL   RBC 4.28 3.77 - 5.28 x10E6/uL   Hemoglobin 12.8 11.1 - 15.9 g/dL   Hematocrit 91.439.3 78.234.0 - 46.6 %   MCV 92 79 - 97 fL   MCH 29.9 26.6 - 33.0 pg   MCHC 32.6 31.5 - 35.7 g/dL   RDW 95.613.5 21.312.3 - 08.615.4 %   Platelets 313 150 - 379 x10E3/uL  Comprehensive metabolic panel  Result Value Ref Range   Glucose 86 65 - 99 mg/dL   BUN 12 6 - 24 mg/dL   Creatinine, Ser 5.780.64 0.57 - 1.00 mg/dL   GFR calc non Af Amer 110 >59 mL/min/1.73   GFR calc Af Amer 126 >59 mL/min/1.73   BUN/Creatinine Ratio 19 9 - 23   Sodium 139 134 - 144 mmol/L   Potassium 5.5 (H) 3.5 - 5.2 mmol/L   Chloride 104 96 - 106 mmol/L   CO2 21 18 - 29 mmol/L   Calcium 9.3 8.7 - 10.2 mg/dL   Total Protein 7.1 6.0 - 8.5 g/dL   Albumin 4.2 3.5 - 5.5 g/dL   Globulin, Total 2.9 1.5 - 4.5 g/dL   Albumin/Globulin Ratio 1.4 1.2 - 2.2   Bilirubin Total 0.2 0.0 - 1.2 mg/dL   Alkaline Phosphatase 69 39 - 117 IU/L   AST 22 0 - 40 IU/L   ALT 19 0 - 32 IU/L  POCT glycosylated hemoglobin (Hb A1C)  Result Value Ref Range   Hemoglobin A1C 5.3   POCT glucose (manual entry)  Result Value Ref Range   POC Glucose 92 70 - 99 mg/dl

## 2016-09-09 LAB — POTASSIUM: Potassium: 4.7 mmol/L (ref 3.5–5.2)

## 2016-09-28 ENCOUNTER — Encounter: Payer: Self-pay | Admitting: Internal Medicine

## 2016-09-28 ENCOUNTER — Ambulatory Visit: Payer: 59 | Attending: Internal Medicine | Admitting: Internal Medicine

## 2016-09-28 VITALS — BP 107/73 | HR 72 | Temp 98.4°F | Resp 16 | Wt 167.8 lb

## 2016-09-28 DIAGNOSIS — R739 Hyperglycemia, unspecified: Secondary | ICD-10-CM | POA: Insufficient documentation

## 2016-09-28 DIAGNOSIS — Z23 Encounter for immunization: Secondary | ICD-10-CM | POA: Diagnosis not present

## 2016-09-28 DIAGNOSIS — J301 Allergic rhinitis due to pollen: Secondary | ICD-10-CM | POA: Diagnosis not present

## 2016-09-28 DIAGNOSIS — F410 Panic disorder [episodic paroxysmal anxiety] without agoraphobia: Secondary | ICD-10-CM | POA: Diagnosis not present

## 2016-09-28 DIAGNOSIS — Z0000 Encounter for general adult medical examination without abnormal findings: Secondary | ICD-10-CM | POA: Diagnosis not present

## 2016-09-28 DIAGNOSIS — R0602 Shortness of breath: Secondary | ICD-10-CM | POA: Diagnosis not present

## 2016-09-28 MED ORDER — ESCITALOPRAM OXALATE 10 MG PO TABS: 10.0000 mg | ORAL_TABLET | Freq: Every day | ORAL | 1 refills | Status: DC

## 2016-09-28 MED ORDER — HYDROXYZINE HCL 25 MG PO TABS: 25.0000 mg | ORAL_TABLET | Freq: Every evening | ORAL | 1 refills | Status: DC | PRN

## 2016-09-28 NOTE — Progress Notes (Signed)
Patient ID: Yesenia Gibson, female    DOB: 10/23/1970  MRN: [id number]  CC: Panic Attack   Subjective: Yesenia Gibson is a 46 y.o. female who presents for 1 mth f/u SOB, anxiety and allergies. Her concerns today include:  1. Allergies: Breathing is better with meds. "I can breath thank God."  2.  Continues to have anxiety attacks every night. - goes to bed and begins obsessing about waking up unable to breath.  -no nocturnal cough, GERD symptoms, drainage at back of throat. No LE edema. -she gets up and walk around to calm herself down.  Then she is able to fall asleep. Still gets intermittent spasms of LT eye lid.   Last PAP was 02/2016 at [facility].  -no vaginal discgh or itching. Strong urine smell.  No dysuria  Patient Active Problem List   Diagnosis Date Noted  . Health care maintenance 09/28/2016  . Seasonal allergic rhinitis due to pollen 09/07/2016  . Anxiety disorder 09/07/2016  . HYPERGLYCEMIA 02/28/2007     Current Outpatient Prescriptions on File Prior to Visit  Medication Sig Dispense Refill  . albuterol (PROVENTIL HFA;VENTOLIN HFA) 108 (90 Base) MCG/ACT inhaler Inhale 2 puffs into the lungs every 6 (six) hours as needed for shortness of breath. 1 Inhaler 3  . fluticasone (FLONASE) 50 MCG/ACT nasal spray Place 1 spray into both nostrils 2 (two) times daily. 16 g 2  . loratadine-pseudoephedrine (CLARITIN-D 24 HOUR) 10-240 MG 24 hr tablet Take 1 tablet by mouth daily. 30 tablet 1  . acetaminophen (TYLENOL) 500 MG tablet Take 500 mg by mouth every 6 (six) hours as needed. For pain    . Black Cohosh-SoyIsoflav-Magnol (ESTROVEN MENOPAUSE RELIEF PO) Take by mouth.    . Ibuprofen-Diphenhydramine Cit (IBUPROFEN PM PO) Take by mouth.     No current facility-administered medications on file prior to visit.     No Known Allergies  Social History   Social History  . Marital status: Single    Spouse name: N/A  . Number of children: 2  . Years of education: 9    Occupational History  . house keeping    Social History Main Topics  . Smoking status: Never Smoker  . Smokeless tobacco: Never Used  . Alcohol use No  . Drug use: No  . Sexual activity: Yes    Birth control/ protection: None   Other Topics Concern  . Not on file   Social History Narrative  . No narrative on file    Family History  Problem Relation Age of Onset  . Anesthesia problems Neg Hx   . Breast cancer Neg Hx     Past Surgical History:  Procedure Laterality Date  . AUGMENTATION MAMMAPLASTY Bilateral 2011   saline implants  . BREAST ENHANCEMENT SURGERY      ROS: Review of Systems As stated above  PHYSICAL EXAM: BP 107/73   Pulse 72   Temp 98.4 F (36.9 C) (Oral)   Resp 16   Wt 167 lb 12.8 oz (76.1 kg)   LMP 09/02/2016 (Exact Date)   SpO2 99%   BMI 28.80 kg/m   Physical Exam General appearance - alert, well appearing, and in no distress Mental status - alert, oriented to person, place, and time, normal mood, behavior, speech, dress, motor activity, and thought processes Neck - supple, no significant adenopathy Chest - clear to auscultation, no wheezes, rales or rhonchi, symmetric air entry Heart - normal rate, regular rhythm, normal S1, S2, no murmurs, rubs, clicks  or gallops Extremities - peripheral pulses normal, no pedal edema, no clubbing or cyanosis  Results for orders placed or performed in visit on 09/08/16  Potassium  Result Value Ref Range   Potassium 4.7 3.5 - 5.2 mmol/L    ASSESSMENT AND PLAN: 1. Panic attacks -encourage deep slow breathing during episodes. -pt agreeable to starting Hydroxyzine QHS PRN and Lexapro daily.  Pt informed may take 4-6 wks to see full effects of Lexapro. F/U sooner if any increase attacks   2. Health care maintenance -Tdap given  PAP documented on [facility] per pt's report of having normal pap 02/2016 through [facility]  Patient was given the opportunity to ask questions.  Patient verbalized understanding of the  plan and was able to repeat key elements of the plan.  [employer] interpreter used during this encounter.   Orders Placed This Encounter  Procedures  . Tdap vaccine greater than or equal to 7yo IM     Requested Prescriptions   Signed Prescriptions Disp Refills  . escitalopram (LEXAPRO) 10 MG tablet 30 tablet 1    Sig: Take 1 tablet (10 mg total) by mouth daily.  . hydrOXYzine (ATARAX/VISTARIL) 25 MG tablet 30 tablet 1    Sig: Take 1 tablet (25 mg total) by mouth at bedtime as needed.    Return in about 2 months (around 11/28/2016) for 15 min appt - f/u anxiety.  Jonah Blue, MD, FACP

## 2016-09-28 NOTE — Progress Notes (Signed)
Pt states she only gets panic attacks at night

## 2016-09-28 NOTE — Patient Instructions (Addendum)
Escitalopram tablets Qu es este medicamento? El ESCITALOPRAM se [place] para el tratamiento de la depresin y ciertos tipos de ansiedad. Este medicamento puede ser utilizado para otros usos; si tiene alguna pregunta consulte con su proveedor de atencin mdica o con su farmacutico. MARCAS COMUNES: Lexapro Qu le debo informar a mi profesional de la salud antes de tomar este medicamento? Necesita saber si usted presenta alguno de los siguientes problemas o situaciones: -trastorno bipolar o antecedentes familiares del trastorno bipolar -diabetes -glaucoma -enfermedad cardiaca -enfermedad renal o heptica -recibe tratamiento electroconvulsivo -convulsiones -ideas suicidas, planes o si usted o alguien de su familia ha intentado un suicidio previo -una reaccin alrgica o inusual al escitalopram, al medicamento relacionado citalopram, a otros medicamentos, alimentos, colorantes o conservadores -si est embarazada o buscando quedar embarazada -si est amamantando a un beb Cmo debo utilizar este medicamento? Tome este medicamento por va oral con un vaso de agua. Siga las instrucciones de la etiqueta del [place]. Puede tomarlo con o sin alimentos. Si el Transport planner, tmelo con alimentos. Tome su medicamento a intervalos regulares. No lo tome con una frecuencia mayor a la indicada. No deje de tomar Coca[employer]a de repente a menos que as lo indique su mdico. Dejar de Insurance account manager medicamento demasiado rpido puede causar efectos secundarios graves o podra empeorar su afeccin. Su farmacutico le dar una [place] del medicamento especial (MedGuide, nombre en ingls) con cada receta y en cada ocasin que la vuelva a surtir. Asegrese de leer esta informacin cada vez cuidadosamente. Hable con su pediatra para informarse acerca del uso de este medicamento en nios. Puede requerir atencin especial. Sobredosis: Pngase en contacto inmediatamente con un centro  toxicolgico o una sala de urgencia si usted cree que haya tomado demasiado medicamento. ATENCIN: [employer] es solo para usted. No comparta este medicamento con nadie. Qu sucede si me olvido de una dosis? Si olvida una dosis, tmela lo antes posible. Si es casi la hora de la prxima dosis, tome slo esa dosis. No tome dosis adicionales o dobles. Qu puede interactuar con este medicamento? No tome este medicamento con ninguno de los siguientes frmacos: ciertos medicamentos para infecciones micticas, tales como fluconazol, itraconazol, ketoconazol, posaconazol y voriconazol cisaprida citalopram dofetilida dronedarona linezolida IMAO, tales como [place], Eldepryl, Marplan, [place] y Parnate azul de metileno (inyectado en una vena) pimozida tioridazina ziprasidona Este medicamento tambin puede interactuar con los siguientes medicamentos: alcohol anfetaminas aspirina y medicamentos tipo aspirina carbamazepina ciertos medicamentos para la depresin, ansiedad o trastornos psicticos ciertos medicamentos para la migraa, tales como almotriptn, eletriptn, frovatriptn, naratriptn, rizatriptn, sumatriptn y zolmitriptn ciertos medicamentos para conciliar el sueo ciertos medicamentos que tratan o previenen cogulos sanguneos, tales como warfarina, enoxaparina, dalteparina cimetidina diurticos fentanilo furazolidona isoniazida litio metoprolol AINE, medicamentos para el dolor y la inflamacin, como ibuprofeno o naproxeno otros medicamentos que prolongan el intervalo QT (causan un ritmo cardiaco anormal) procarbazina rasagilina suplementos tales [place], kava kava y valeriana tramadol triptfano Puede ser que esta lista no menciona todas las posibles interacciones. Informe a su profesional de [employer] Home	[place] de [employer] productos a base de hierbas, medicamentos de [place] o suplementos nutritivos que est tomando. Si usted fuma, consume bebidas alcohlicas o si utiliza drogas ilegales,  indqueselo tambin a su profesional de KB Home	[place]. Algunas sustancias pueden interactuar con su medicamento. A qu debo estar atento al usar Coca-Cola? Informe a su mdico si sus sntomas no mejoran o si empeoran. Visite a su mdico o  a su profesional de la salud para Insurance underwriter. Debido que puede ser necesario tomar este medicamento durante varias semanas para que sea posible observar sus efectos en forma [place], es importante que sigue su tratamiento como recetado por su mdico. Los pacientes y sus familias deben estar atentos si empeora la depresin o ideas suicidas. Tambin est atento a cambios repentinos o severos de emocin, tales como el sentirse ansioso, agitado, lleno de pnico, irritable, hostil, agresivo, impulsivo, inquietud severa, demasiado excitado y hiperactivo o dificultad para conciliar el sueo. Si esto ocurre, especialmente al comenzar con el tratamiento o al cambiar de dosis, comunquese con su profesional de [employer]. Puede experimentar somnolencia o mareos. No conduzca ni utilice maquinaria, ni haga nada que Scientist, research (life sciences) en estado de alerta hasta que sepa cmo le afecta este medicamento. No se siente ni se ponga de pie con rapidez, especialmente si es un paciente de edad avanzada. Esto reduce el riesgo de mareos o [employer]. El alcohol puede interferir con el efecto de [place]. Evite consumir bebidas alcohlicas. Se le podr secar la boca. Masticar chicle sin azcar, chupar caramelos duros y beber agua en abundancia le ayudar a mantener la boca hmeda. Si el problema no desaparece o es severo, consulte a su mdico. Qu efectos secundarios puedo tener al [employer] este medicamento? Efectos secundarios que debe informar a su mdico o a Producer, television/film/video de la salud tan pronto como sea posible: Therapist, art, como erupcin cutnea, comezn/picazn o urticarias, e hinchazn de la cara, los labios o la lengua ansiedad heces de color  negro y aspecto alquitranado cambios en la visin confusin estado de nimo elevado, menor necesidad de dormir, pensamientos acelerados, conducta impulsiva dolor ocular ritmo cardiaco rpido, irregular sensacin de desmayos o aturdimiento, cadas sensacin de agitacin, enojo o irritabilidad alucinaciones, prdida del contacto con la realidad prdida de equilibrio o coordinacin prdida de memoria ereccin dolorosa o prolongada inquietud, caminar de un lado a otro, incapacidad para quedarse quieto convulsiones rigidez de los [employer] Corporation ideas suicidas u otros cambios en el estado de nimo dificultad para conciliar el sueo sangrado o moretones inusuales cansancio o debilidad inusual vmito Efectos secundarios que generalmente no requieren atencin mdica (infrmelos a su mdico o a Producer, television/film/video de la salud si persisten o si son molestos): cambios en el apetito cambios en el deseo o desempeo sexual dolor de cabeza aumento de la sudoracin indigestin, nuseas temblores Puede ser que esta lista no menciona todos los posibles efectos secundarios. Comunquese a su mdico por asesoramiento mdico [employer]lett[employer]ard. Usted puede informar los efectos secundarios a la FDA por telfono al 1-800-FDA-1088. Dnde debo guardar mi medicina? Mantngala fuera del alcance de los nios. Gurdela a [employer]I, entre 15 y 30 grados C (38 y 58 grados F). Deseche todo el medicamento que no haya utilizado, despus de la fecha de vencimiento. ATENCIN: Este folleto es un resumen. Puede ser que no cubra toda la posible informacin. Si usted tiene preguntas acerca de esta medicina, consulte con su mdico, su farmacutico o su profesional de Radiographer, therapeutic.  2018 Elsevier/Gold Standard (2016-05-13 00:00:00)  Madilyn Fireman Td (contra la difteria y el ttanos): Lo que debe saber (Td Vaccine [Tetanus and Diphtheria]: What You Need to Know) 1. Por qu vacunarse? El ttanos y la difteria son enfermedades muy graves. Son  Academic librarian frecuentes en los [place] actualmente, pero las personas que se infectan suelen tener complicaciones graves. La vacuna Td se [place] para proteger a los adolescentes y  a los adultos de [employer]. Tanto el ttanos como la difteria son infecciones causadas por bacterias. La difteria se transmite de persona a persona a travs de la tos o el estornudo. La bacteria que causa el ttanos entra al cuerpo a travs de cortes, raspones o heridas. El TTANOS (trismo) provoca entumecimiento y Engineer, materials dolorosa de los msculos, por lo general, en todo el cuerpo.  Puede causar el endurecimiento de los msculos de la cabeza y el cuello, de modo que impide abrir la boca, tragar y en algunos casos, Industrial/product designer. El ttanos es causa de muerte en aproximadamente 1de cada 10personas que contraen la infeccin, incluso despus de que reciben la mejor atencin mdica. La DIFTERIA puede hacer que se forme una membrana gruesa en la parte posterior de la garganta.  Puede causar problemas respiratorios, parlisis, insuficiencia cardaca e incluso la muerte. Antes de las vacunas, en los [place] se informaban 200000 casos de difteria y cientos de casos de ttanos cada ao. Desde que comenz la vacunacin, los informes de casos de ambas enfermedades se han reducido en un 99%. 2. Madilyn Fireman Td La vacuna Td protege a adolescentes y adultos contra el ttanos y la difteria. La vacuna Td habitualmente se aplica como dosis de refuerzo cada 10aos, pero tambin puede administrarse antes si la persona sufre una [place] o herida sucia y grave. A veces, en lugar de la vacuna Td, se recomienda una vacuna llamada Tdap, que protege contra la tosferina, adems de proteger contra el ttanos y la difteria. El mdico o la persona que le aplique la vacuna puede darle ms informacin al [employer]. La Td puede administrarse de manera segura simultneamente con otras vacunas. 3. Algunas personas no deben recibir la vacuna  Una  persona que alguna vez ha tenido una reaccin alrgica potencialmente mortal a una dosis anterior de cualquier vacuna contra el ttanos o la difteria, O que tenga una alergia grave a cualquier parte de esta vacuna, no debe recibir la vacuna Td. Informe a la persona que le aplica la vacuna si usted tiene cualquier alergia grave.  Consulte con su mdico si: ? tuvo hinchazn o dolor intenso despus de recibir cualquier vacuna contra la difteria o el ttanos, ? alguna vez ha sufrido el sndrome de Pension scheme manager, ? no se siente Research scientist (life sciences) en que se ha programado la vacuna.  4. Riesgos de [place] reaccin a la vacuna Con cualquier medicamento, incluyendo las vacunas, existe la posibilidad de que aparezcan efectos secundarios. Suelen ser leves y desaparecen por s solos. Tambin son posibles las reacciones graves, pero en raras ocasiones. La Harley[employer]Davidson de las personas a las que se les aplica la vacuna Td no tienen ningn problema. Problemas leves despus de la vacuna Td: (No interfirieron en otras actividades)  Dolor en el lugar donde se aplic la vacuna (alrededor de 8de cada 10personas)  Enrojecimiento o hinchazn en el lugar donde se aplic la vacuna (alrededor de 1de cada 4personas)  Fiebre leve (poco frecuente)  Dolor de Training and development officer (alrededor de 1de cada 4personas)  Cansancio (alrededor de 1de cada 4personas) Problemas moderados despus de la vacuna Td: (Interfirieron en otras actividades, pero no requirieron atencin mdica)  Fiebre superior a 102F (38,8C) (poco frecuente) Problemas graves despus de la vacuna Td: (Impidieron Education officer, environmental las actividades habituales; requirieron atencin mdica)  Buyer, retail, dolor intenso, sangrado o enrojecimiento en el brazo en que se aplic la vacuna (poco frecuente). Problemas que podran ocurrir despus de cualquier vacuna:  Las personas a veces se desmayan despus de un  procedimiento mdico, incluida la vacunacin. Si permanece sentado o recostado  durante 15 minutos puede ayudar a Lubrizol Corporationevitar los desmayos y las lesiones causadas por las cadas. Informe al mdico si se siente mareado, tiene cambios en la visin o zumbidos en los odos.  Algunas personas sienten un dolor intenso en el hombro y tienen dificultad para mover el brazo donde se coloc la vacuna. Esto sucede con muy poca frecuencia.  Cualquier medicamento puede causar una reaccin alrgica grave. Dichas reacciones son Lynnae Sandhoffmuy poco frecuentes con una vacuna (se calcula que menos de 1en un milln de dosis) y se producen de unos minutos a unas horas despus de Arts development officerla administracin. Al igual que con cualquier Automatic Datamedicamento, existe una probabilidad muy remota de que una vacuna cause una lesin grave o la Pinecraftmuerte. Se controla permanentemente la seguridad de las vacunas. Para obtener ms informacin, visite: http://floyd.org/www.cdc.gov/vaccinesafety/. 5. Qu pasa si hay una reaccin grave? A qu signos debo estar atento?  Observe todo lo que le preocupe, como signos de una reaccin alrgica grave, fiebre muy alta o comportamiento fuera de lo normal. Los signos de una reaccin alrgica grave pueden incluir ronchas, hinchazn de la cara y la garganta, dificultad para respirar, latidos cardacos acelerados, mareos y debilidad. Generalmente, estos comenzaran entre unos pocos minutos y algunas horas despus de la vacunacin. Qu debo hacer?  Si usted piensa que se trata de una reaccin alrgica grave o de otra emergencia que no puede esperar, llame al 911 o dirjase al hospital ms cercano. Sino, llame a su mdico.  Despus, la reaccin debe informarse al [address] de Informacin sobre Efectos Adversos de las [place]esterVacunas (Vaccine Adverse Event Reporting System, VAERS). Su mdico puede presentar este informe, o puede hacerlo usted mismo a travs del sitio web de VAERS, en www.vaers.LAgents.nohhs.gov, o llamando al [phone]. VAERS no brinda recomendaciones mdicas. 6. SunTrustPrograma Nacional de Compensacin de Daos por American Electric PowerVacunas El  ShawnachesterPrograma Nacional de Compensacin de Daos por Administrator, artsVacunas (National Vaccine Injury Compensation Program, VICP) es un programa federal que fue creado para Patent examinercompensar a las personas que puedan haber sufrido daos al recibir ciertas vacunas. Aquellas personas que consideren que han sufrido un dao como consecuencia de una vacuna y Hondurasquieran saber ms acerca del programa y de cmo presentar Roslynn Ambleuna denuncia, pueden llamar al [phone] o visitar su sitio web en SpiritualWord.atwww.hrsa.gov/vaccinecompensation. Hay un lmite de tiempo para presentar un reclamo de compensacin. 7. Cmo puedo obtener ms informacin?  Consulte a su mdico. Este puede darle el prospecto de la vacuna o recomendarle otras fuentes de informacin.  Comunquese con el servicio de salud de su localidad o [address] estado.  Comunquese con los Centros para Air traffic controllerel Control y la Prevencin de Child psychotherapistnfermedades (Centers for Disease Control and Prevention , CDC). ? Llame al [phone] (1-800-CDC-INFO). ? Visite el sitio Environmental managerweb de los CDC en PicCapture.uywww.cdc.gov/vaccines. Declaracin de informacin sobre la vacuna contra la difteria y el ttanos (Td) de los CDC (08/05/15) Esta informacin no tiene Theme park managercomo fin reemplazar el consejo del mdico. Asegrese de hacerle al mdico cualquier pregunta que tenga. Document Released: 07/29/2008 Document Revised: 05/03/2014 Document Reviewed: 08/05/2015 Elsevier Interactive Patient Education  2017 ArvinMeritorElsevier Inc.

## 2016-10-04 ENCOUNTER — Telehealth: Payer: Self-pay | Admitting: Internal Medicine

## 2016-10-04 ENCOUNTER — Other Ambulatory Visit: Payer: Self-pay | Admitting: Pharmacist

## 2016-10-04 DIAGNOSIS — F410 Panic disorder [episodic paroxysmal anxiety] without agoraphobia: Secondary | ICD-10-CM

## 2016-10-04 MED ORDER — ESCITALOPRAM OXALATE 10 MG PO TABS: 10.0000 mg | ORAL_TABLET | Freq: Every day | ORAL | 1 refills | Status: DC

## 2016-10-04 MED FILL — ?ESCITALOPRAM 10 MG TABLET: 10 | 30 days supply | Qty: 30 | Fill #0

## 2016-11-01 MED FILL — ?ESCITALOPRAM 10 MG TABLET: 10 | 30 days supply | Qty: 30 | Fill #1

## 2016-12-07 ENCOUNTER — Ambulatory Visit: Payer: 59 | Admitting: Internal Medicine

## 2016-12-07 ENCOUNTER — Telehealth: Payer: Self-pay | Admitting: Internal Medicine

## 2016-12-07 NOTE — Telephone Encounter (Signed)
Pt. Called requesting to speak with her nurse regarding her escitalopram (LEXAPRO) 10 MG tablet  Pt. States that she takes the medication in the evening and it makes her drowsy. Pt. States that when she wakes up in the morning she is still drowsy. Plase f/u with pt.

## 2016-12-07 NOTE — Telephone Encounter (Signed)
Will forward to pcp

## 2016-12-08 ENCOUNTER — Other Ambulatory Visit: Payer: Self-pay | Admitting: Internal Medicine

## 2016-12-08 ENCOUNTER — Ambulatory Visit: Payer: 59 | Attending: Internal Medicine

## 2016-12-08 DIAGNOSIS — F410 Panic disorder [episodic paroxysmal anxiety] without agoraphobia: Secondary | ICD-10-CM

## 2016-12-08 NOTE — Telephone Encounter (Signed)
Will forward to carlos. Could you please schedule an appointment

## 2016-12-17 ENCOUNTER — Ambulatory Visit: Payer: 59 | Admitting: Internal Medicine

## 2017-02-25 ENCOUNTER — Encounter: Payer: 59 | Admitting: Internal Medicine

## 2017-03-31 ENCOUNTER — Encounter: Payer: 59 | Admitting: Internal Medicine

## 2017-10-04 ENCOUNTER — Other Ambulatory Visit: Payer: Self-pay | Admitting: Internal Medicine

## 2017-10-04 DIAGNOSIS — Z1231 Encounter for screening mammogram for malignant neoplasm of breast: Secondary | ICD-10-CM

## 2017-11-01 ENCOUNTER — Ambulatory Visit
Admission: RE | Admit: 2017-11-01 | Discharge: 2017-11-01 | Disposition: A | Discharge status: Home / Self Care | Payer: 59 | Source: Ambulatory Visit | Attending: Internal Medicine | Admitting: Internal Medicine

## 2017-11-01 DIAGNOSIS — Z1231 Encounter for screening mammogram for malignant neoplasm of breast: Secondary | ICD-10-CM

## 2018-02-24 HISTORY — PX: AUGMENTATION MAMMAPLASTY: SUR837

## 2018-07-25 ENCOUNTER — Ambulatory Visit: Payer: 59 | Admitting: Nurse Practitioner

## 2018-09-20 ENCOUNTER — Other Ambulatory Visit: Payer: Self-pay | Admitting: Nurse Practitioner

## 2018-09-22 ENCOUNTER — Ambulatory Visit: Payer: Managed Care, Other (non HMO) | Attending: Nurse Practitioner | Admitting: Nurse Practitioner

## 2018-09-22 ENCOUNTER — Other Ambulatory Visit: Payer: Self-pay

## 2018-10-12 ENCOUNTER — Other Ambulatory Visit: Payer: Self-pay | Admitting: General Practice

## 2018-10-12 DIAGNOSIS — Z1231 Encounter for screening mammogram for malignant neoplasm of breast: Secondary | ICD-10-CM

## 2018-11-10 ENCOUNTER — Other Ambulatory Visit: Payer: Self-pay | Admitting: General Practice

## 2018-11-10 ENCOUNTER — Other Ambulatory Visit: Payer: Self-pay

## 2018-11-10 ENCOUNTER — Ambulatory Visit
Admission: RE | Admit: 2018-11-10 | Discharge: 2018-11-10 | Disposition: A | Discharge status: Home / Self Care | Payer: Managed Care, Other (non HMO) | Source: Ambulatory Visit | Attending: General Practice | Admitting: General Practice

## 2018-11-10 DIAGNOSIS — Z1231 Encounter for screening mammogram for malignant neoplasm of breast: Secondary | ICD-10-CM

## 2018-11-14 ENCOUNTER — Other Ambulatory Visit: Payer: Self-pay | Admitting: Internal Medicine

## 2018-11-14 ENCOUNTER — Other Ambulatory Visit: Payer: Self-pay | Admitting: General Practice

## 2018-11-14 DIAGNOSIS — N6452 Nipple discharge: Secondary | ICD-10-CM

## 2018-11-30 ENCOUNTER — Ambulatory Visit
Admission: RE | Admit: 2018-11-30 | Discharge: 2018-11-30 | Disposition: A | Discharge status: Home / Self Care | Payer: Managed Care, Other (non HMO) | Source: Ambulatory Visit | Attending: General Practice | Admitting: General Practice

## 2018-11-30 ENCOUNTER — Other Ambulatory Visit: Payer: Self-pay

## 2018-11-30 ENCOUNTER — Other Ambulatory Visit: Payer: Self-pay | Admitting: General Practice

## 2018-11-30 DIAGNOSIS — N6452 Nipple discharge: Secondary | ICD-10-CM

## 2018-11-30 DIAGNOSIS — N6489 Other specified disorders of breast: Secondary | ICD-10-CM

## 2019-06-05 ENCOUNTER — Inpatient Hospital Stay: Admission: RE | Admit: 2019-06-05 | Payer: Managed Care, Other (non HMO) | Source: Ambulatory Visit

## 2020-07-07 ENCOUNTER — Ambulatory Visit: Payer: Managed Care, Other (non HMO) | Admitting: Internal Medicine

## 2020-07-07 DIAGNOSIS — Z0289 Encounter for other administrative examinations: Secondary | ICD-10-CM

## 2020-08-14 ENCOUNTER — Encounter: Payer: Self-pay | Admitting: Internal Medicine

## 2020-08-14 ENCOUNTER — Ambulatory Visit: Payer: BC Managed Care – PPO | Attending: Internal Medicine | Admitting: Internal Medicine

## 2020-08-14 ENCOUNTER — Other Ambulatory Visit: Payer: Self-pay

## 2020-08-14 VITALS — BP 138/83 | HR 77 | Resp 18 | Ht 65.0 in | Wt 183.5 lb

## 2020-08-14 DIAGNOSIS — N951 Menopausal and female climacteric states: Secondary | ICD-10-CM | POA: Insufficient documentation

## 2020-08-14 DIAGNOSIS — E669 Obesity, unspecified: Secondary | ICD-10-CM | POA: Diagnosis not present

## 2020-08-14 DIAGNOSIS — Z2821 Immunization not carried out because of patient refusal: Secondary | ICD-10-CM | POA: Diagnosis not present

## 2020-08-14 NOTE — Progress Notes (Signed)
Patient ID: Yesenia Gibson, female    DOB: [birth year]  MRN: [id number]  CC: New Patient (Initial Visit)   Subjective: Yesenia Gibson is a 50 y.o. female who presents for re-est care.  Last seen 09/2016 Her concerns today include:  Panic attacks, seasonal allergies  Pt reports infrequent menses.  Last mense 06/2019 then had menses 04/2020 that lasted 5 days.  Wants to know if she is going through the change of life.  She endorses hot flashes sometimes.  She is also concern about wgh gain.  Gained 16 lbs since 2018 since last seen;  8 lbs of it in last 2 mths She made some changes to her diet since January of this year.  She has cut out drinking sodas, eating bread and sugary snacks.  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  Saw a nutrionist in Jan 2022.  Found it helpful but felt the 1500 cal/day diet was too restrictive   HM: Thinks she had Pap smear done in 2019 in [facility] when she saw her gynecologist for abnormal bleeding.  Has not had COVID-19 vaccine and does not wish to have it.  Patient Active Problem List   Diagnosis Date Noted  . Health care maintenance 09/28/2016  . Seasonal allergic rhinitis due to pollen 09/07/2016  . Anxiety disorder 09/07/2016     Current Outpatient Medications on File Prior to Visit  Medication Sig Dispense Refill  . acetaminophen (TYLENOL) 500 MG tablet Take 500 mg by mouth every 6 (six) hours as needed. For pain    . albuterol (PROVENTIL HFA;VENTOLIN HFA) 108 (90 Base) MCG/ACT inhaler Inhale 2 puffs into the lungs every 6 (six) hours as needed for shortness of breath. (Patient not taking: Reported on 08/14/2020) 1 Inhaler 3  . Black Cohosh-SoyIsoflav-Magnol (ESTROVEN MENOPAUSE RELIEF PO) Take by mouth. (Patient not taking: Reported on 08/14/2020)    . escitalopram (LEXAPRO) 10 MG tablet Take 1 tablet (10 mg total) by mouth daily. (Patient not taking: Reported on 08/14/2020) 30 tablet 1  . fluticasone (FLONASE) 50 MCG/ACT nasal spray  Place 1 spray into both nostrils 2 (two) times daily. (Patient not taking: Reported on 08/14/2020) 16 g 2  . hydrOXYzine (ATARAX/VISTARIL) 25 MG tablet TAKE 1 TABLET BY MOUTH AT BEDTIME AS NEEDED (Patient not taking: Reported on 08/14/2020) 30 tablet 0  . Ibuprofen-Diphenhydramine Cit (IBUPROFEN PM PO) Take by mouth. (Patient not taking: Reported on 08/14/2020)    . loratadine-pseudoephedrine (CLARITIN-D 24 HOUR) 10-240 MG 24 hr tablet Take 1 tablet by mouth daily. (Patient not taking: Reported on 08/14/2020) 30 tablet 1   No current facility-administered medications on file prior to visit.    No Known Allergies  Social History   Socioeconomic History  . Marital status: Single    Spouse name: Not on file  . Number of children: 2  . Years of education: 9  . Highest education level: Not on file  Occupational History  . Occupation: house keeping  Tobacco Use  . Smoking status: Never Smoker  . Smokeless tobacco: Never Used  Vaping Use  . Vaping Use: Never used  Substance and Sexual Activity  . Alcohol use: No  . Drug use: No  . Sexual activity: Yes    Birth control/protection: None  Other Topics Concern  . Not on file  Social History Narrative  . Not on file   Social Determinants of Health   Financial Resource Strain: Not on file  Food Insecurity: Not on  file  Transportation Needs: Not on file  Physical Activity: Not on file  Stress: Not on file  Social Connections: Not on file  Intimate Partner Violence: Not on file    Family History  Problem Relation Age of Onset  . Anesthesia problems Neg Hx   . Breast cancer Neg Hx     Past Surgical History:  Procedure Laterality Date  . AUGMENTATION MAMMAPLASTY Bilateral 2011   saline implants  . AUGMENTATION MAMMAPLASTY Bilateral 02/2018   explants   . BREAST ENHANCEMENT SURGERY      ROS: Review of Systems Negative except as stated above  PHYSICAL EXAM: BP 138/83   Pulse 77   Resp 18   Ht 5\' 5"  (1.651 m)   Wt 183 lb  8 oz (83.2 kg)   SpO2 98%   BMI 30.54 kg/m   Wt Readings from Last 3 Encounters:  08/14/20 183 lb 8 oz (83.2 kg)  09/28/16 167 lb 12.8 oz (76.1 kg)  09/07/16 165 lb 9.6 oz (75.1 kg)    Physical Exam  General appearance - alert, well appearing, middle age hispanic female and in no distress Mental status - normal mood, behavior, speech, dress, motor activity, and thought processes Neck - supple, no significant adenopathy Chest - clear to auscultation, no wheezes, rales or rhonchi, symmetric air entry Heart - normal rate, regular rhythm, normal S1, S2, no murmurs, rubs, clicks or gallops Extremities - peripheral pulses normal, no pedal edema, no clubbing or cyanosis  Depression screen [facility] 2/9 08/14/2020 09/28/2016 09/07/2016  Decreased Interest 1 1 0  Down, Depressed, Hopeless 0 3 0  PHQ - 2 Score 1 4 0  Altered sleeping - 2 0  Tired, decreased energy - 2 0  Change in appetite - 2 0  Feeling bad or failure about yourself  - 1 0  Trouble concentrating - 1 0  Moving slowly or fidgety/restless - 0 0  Suicidal thoughts - 0 0  PHQ-9 Score - 12 0  Difficult doing work/chores - - Not difficult at all   GAD 7 : Generalized Anxiety Score 08/14/2020 09/28/2016 09/07/2016  Nervous, Anxious, on Edge 0 1 2  Control/stop worrying 0 1 2  Worry too much - different things 0 1 2  Trouble relaxing 0 1 2  Restless 0 1 2  Easily annoyed or irritable 0 1 2  Afraid - awful might happen 0 1 2  Total GAD 7 Score 0 7 14  Anxiety Difficulty - - Somewhat difficult     CMP Latest Ref Rng & Units 09/08/2016 09/07/2016 07/24/2011  Glucose 65 - 99 mg/dL - 86 -  BUN 6 - 24 mg/dL - 12 13  Creatinine 07/26/2011 - 1.00 mg/dL - 1.49 7.02  Sodium 6.37 - 144 mmol/L - 139 -  Potassium 3.5 - 5.2 mmol/L 4.7 5.5(H) -  Chloride 96 - 106 mmol/L - 104 -  CO2 18 - 29 mmol/L - 21 -  Calcium 8.7 - 10.2 mg/dL - 9.3 -  Total Protein 6.0 - 8.5 g/dL - 7.1 -  Total Bilirubin 0.0 - 1.2 mg/dL - 0.2 -  Alkaline Phos 39 - 117 IU/L - 69  -  AST 0 - 40 IU/L - 22 13  ALT 0 - 32 IU/L - 19 -   Lipid Panel     Component Value Date/Time   CHOL 153 09/25/2007 2030   TRIG 100 09/25/2007 2030   HDL 47 09/25/2007 2030   CHOLHDL 3.3 Ratio 09/25/2007 2030  VLDL 20 09/25/2007 2030   LDLCALC 86 09/25/2007 2030    CBC    Component Value Date/Time   WBC 5.2 09/07/2016 0950   WBC 9.8 07/24/2011 2155   RBC 4.28 09/07/2016 0950   RBC 4.02 07/24/2011 2155   HGB 12.8 09/07/2016 0950   HCT 39.3 09/07/2016 0950   PLT 313 09/07/2016 0950   MCV 92 09/07/2016 0950   MCH 29.9 09/07/2016 0950   MCH 31.1 07/24/2011 2155   MCHC 32.6 09/07/2016 0950   MCHC 34.2 07/24/2011 2155   RDW 13.5 09/07/2016 0950   LYMPHSABS 2.3 09/25/2007 2030   MONOABS 0.3 09/25/2007 2030   EOSABS 0.1 09/25/2007 2030   BASOSABS 0.0 09/25/2007 2030    ASSESSMENT AND PLAN: 1. Obesity (BMI 30.0-34.9) Discussed the importance of permanent change in eating habits and need for regular exercise to achieve weight loss. Advised to eliminate sugary drinks from the diet, eat several servings of fruits a day, incorporate fresh vegetables daily into the diet, eat less beef and pork and more lean meat instead, cut back on portion sizes of white carbohydrates.  In terms of exercise, I have encouraged her to get in some form of moderate intensity exercise for a total of 150 minutes/week which can be divided up however she likes.  Informed her of our medical weight management program.  She is agreeable for referral as she is very motivated to get her weight back down. - Amb Ref to Medical Weight Management - CBC - Comprehensive metabolic panel - Lipid panel - Hemoglobin A1c  2. Perimenopausal Discussed perimenopause and symptoms that can occur with it including infrequent menstrual bleeding, mood swings, hot flashes etc.  Encourage healthy eating and regular exercise.  Printed information given.  3. COVID-19 vaccination declined Encouraged her to get COVID-19 vaccine  done to help protect herself and her community.  Patient declines.  I looked in the system and see that she did not have the Pap smear done as she thought she did in 2019.  She is agreeable to returning to have her Pap smear done at a later date.   Patient was given the opportunity to ask questions.  Patient verbalized understanding of the plan and was able to repeat key elements of the plan.   Orders Placed This Encounter  Procedures  . CBC  . Comprehensive metabolic panel  . Lipid panel  . Hemoglobin A1c  . Amb Ref to Medical Weight Management     Requested Prescriptions    No prescriptions requested or ordered in this encounter    Return in about 4 weeks (around 09/11/2020) for PAP.  Jonah Blue, MD, FACP

## 2020-08-14 NOTE — Patient Instructions (Signed)
Princella Ion of Endocrinology (14th ed., pp. [phone]). [place], [place]: Elsevier.">  Perimenopausia Perimenopause La perimenopausia es el momento normal de la vida de Nurse, mental health en el que los niveles de [place], la hormona femenina producida por los ovarios, comienzan a disminuir. Esto provoca cambios en los perodos menstruales antes de que se detengan por completo (menopausia). La perimenopausia puede comenzar de 2 a 8 aos antes de [employer]. Durante la perimenopausia, los ovarios pueden o no producir vulos y Architectural technologist an puede quedar [place]. Cules son las causas? La perimenopausia es causada por un cambio natural en los niveles hormonales que se produce a medida que las mujeres envejecen. Qu incrementa el riesgo? Es ms probable que la perimenopausia comience a una edad ms temprana si tiene ciertas afecciones o se ha realizado ciertos tratamientos, incluidos los siguientes:  Un tumor en la hipfisis del cerebro.  Una enfermedad que afecte los ovarios y la produccin de hormonas.  Ciertos tratamientos para el cncer, como quimioterapia o terapia hormonal, o radioterapia en la pelvis.  Fumar mucho y consumir alcohol de forma excesiva.  Antecedentes familiares de menopausia temprana. Cules son los signos o sntomas? Los cambios por la perimenopausia afectan de [place] diferente a Journalist, newspaper. Los sntomas de esta afeccin pueden incluir los siguientes:  Engineer, maintenance.  Perodos menstruales irregulares.  Sudoracin nocturna.  Cambios en el sentimiento respecto de las [employer]. Es posible que el deseo sexual disminuya o que se sienta ms incmoda respecto de su sexualidad.  Sequedad vaginal.  Dolores de cabeza.  Cambios en el estado de nimo.  Depresin.  Problemas para dormir (insomnio).  Problemas de memoria o dificultad para concentrarse.  Irritabilidad.  Cansancio.  Aumento de [place].  Ansiedad.  Problemas para quedar  embarazada. Cmo se diagnostica? Esta afeccin se diagnostica en funcin de los antecedentes mdicos, un examen fsico, la edad, los antecedentes menstruales y los sntomas. Tambin le podran realizar estudios hormonales. Cmo se trata? En algunos los casos, no se necesita tratamiento. Usted y el mdico deben decidir juntos si se debe Pensions consultant. El tratamiento se determinar en funcin de su cuadro clnico y de sus preferencias. Existen varios tratamientos disponibles, como:  Terapia hormonal para la menopausia.  Medicamentos para tratar sntomas especficos.  Acupuntura.  Vitaminas o suplementos herbales. Antes de Microbiologist, es importante que le avise al mdico si tiene antecedentes personales o familiares de lo siguiente:  Enfermedad cardaca.  Cncer de mama.  Cogulos de [place].  Diabetes.  Osteoporosis. Siga estas instrucciones en su casa: Medicamentos  Use los medicamentos de venta libre y los recetados solamente como se lo haya indicado el mdico.  Tome los suplementos vitamnicos solamente como se lo haya indicado el mdico.  Hable con el mdico antes de Corporate investment banker a tomar cualquier suplemento herbal. Estilo de vida  No consuma ningn producto que contenga nicotina o tabaco, como cigarrillos, cigarrillos electrnicos y tabaco de Theatre manager. Si necesita ayuda para dejar de consumir estos productos, consulte al mdico.  Realice, por lo menos, de actividad fsica 5das por semana o ms.  Siga una dieta equilibrada que incluya frutas y verduras frescas, cereales integrales, soja, huevos, carnes magras y lcteos descremados.  Evite las bebidas con alcohol o cafena, as como las [employer] Esto podra ayudar a prevenir los acaloramientos.  Intente dormir de 7 a 8horas todas las noches.  Vstase con capas de prendas que pueda quitarse para Clinical cytogeneticist.  Encuentre modos de Charity fundraiser, por  ejemplo, a travs de la respiracin, meditacin o un diario ntimo.   Indicaciones generales  Lleve un registro de sus perodos menstruales; incluya lo siguiente: ? El momento en que ocurren. ? Qu tan abundantes son y cunto duran. ? Cunto tiempo transcurre entre cada perodo menstrual.  Lleve un registro de los sntomas; anote cundo comienzan, con qu frecuencia ocurren y cunto duran.  Use lubricantes o humectantes vaginales para aliviar la sequedad vaginal y Personnel officer durante las relaciones sexuales.  An puede quedar embarazada si tiene periodos irregulares. Asegrese de [employer] anticonceptivos durante la perimenopausia si no desea quedar embarazada.  Cumpla con todas las visitas de seguimiento. Esto es importante. Esto incluye la terapia grupal y la psicoterapia.   Comunquese con un mdico si:  Tiene una hemorragia vaginal abundante o despide cogulos de sangre.  Su perodo menstrual dura ms de 2das que lo habitual.  Sus perodos menstruales se producen con Designer, jewellery [address].  Sangra despus de [employer].  Siente dolor durante las [employer]. Solicite ayuda de inmediato si tiene:  Journalist, newspaper, dificultad para respirar o dificultad para hablar.  Depresin grave.  Dolor al Beatrix Shipper.  Dolor de cabeza intenso.  Problemas de visin. Resumen  La perimenopausia es el perodo en el cual el cuerpo de la mujer comienza a pasar a la menopausia. Esto puede suceder naturalmente o como consecuencia de otros problemas de salud o tratamientos mdicos.  La perimenopausia puede comenzar entre 2y 8aos antes de la menopausia y suele durar varios aos.  Los sntomas de la perimenopausia pueden controlarse con medicamentos, cambios en el estilo de vida y tratamientos complementarios, como la acupuntura. Esta informacin no tiene Theme park manager el consejo del mdico. Asegrese de hacerle al mdico cualquier pregunta que  tenga. Document Revised: 11/13/2019 Document Reviewed: 11/13/2019 Elsevier Patient Education  2021 Elsevier Inc.   Alimentacin saludable Healthy Eating Seguir una modalidad de alimentacin saludable puede ayudarlo a Barista y Pharmacologist un peso saludable, reducir el riesgo de tener enfermedades crnicas y vivir [place] vida larga y productiva. Es importante que siga una modalidad de alimentacin saludable con un nivel adecuado de caloras para su cuerpo. Debe cubrir sus necesidades nutricionales principalmente a travs de los alimentos, escogiendo una variedad de alimentos ricos en nutrientes. Cules son algunos consejos para seguir este plan? Lea las etiquetas de los alimentos  Lea las etiquetas y elija las que digan lo siguiente: ? Reducido en sodio o con bajo contenido de [place]. ? Jugos con 100% jugo de fruta. ? Alimentos con bajo contenido de grasas saturadas y alto contenido de grasas poliinsaturadas y Mining engineer. ? Alimentos con cereales integrales, como trigo integral, trigo partido, arroz integral y arroz salvaje. ? Cereales integrales fortificados con cido flico. Se recomienda a las mujeres embarazadas o que desean quedar embarazadas.  Lea las etiquetas y evite: ? Los alimentos con una gran cantidad de Biochemist, clinical. Estos incluyen los alimentos que contienen azcar moreno, endulzante a base de maz, jarabe de maz, dextrosa, fructosa, glucosa, jarabe de maz de alta fructosa, miel, azcar invertido, lactosa, jarabe de [place], maltosa, [place], azcar sin refinar, sacarosa, trehalosa y azcar turbinado.  No consuma ms que las siguientes cantidades de azcar agregada por da:  6 cucharaditas (25 g) las mujeres.  9 cucharaditas (38 g) los hombres. ? Los alimentos que contienen almidones y cereales refinados o procesados. ? Los productos de cereales refinados, como harina [place], harina de maz desgerminada, pan blanco y arroz blanco. Al  ir de compras  Elija refrigerios  ricos en nutrientes, como verduras, frutas enteras y frutos secos. Evite los refrigerios con alto contenido de caloras y International aid/development worker, como las papas fritas, los refrigerios frutales y los caramelos.  Use alios y productos para untar a base de aceite con los Publishing rights manager de grasas slidas como la [place], la margarina en barra o el queso crema.  Limite las salsas, las mezclas y los productos "instantneos" preelaborados como el arroz saborizado, los fideos instantneos y las pastas listas para comer.  Pruebe ms fuentes de protena vegetal, como tofu, tempeh, frijoles negros, edamame, lentejas, frutos secos y semillas.  Explore planes de alimentacin como la dieta mediterrnea o la dieta vegetariana. Al cocinar  Use aceite para Designer, multimedia de grasas slidas como [place], margarina en barra o [place] de cerdo.  En lugar de frer, trate de cocinar en el horno, en la plancha o en la parrilla, o hervir los alimentos.  Retire la parte grasa de las carnes antes de cocinarlas.  Cocine las verduras al vapor en agua o caldo. Planificacin de las comidas  En las comidas, imagine dividir su plato en cuartos: ? La mitad del plato tiene frutas y verduras. ? Un cuarto del plato tiene cereales integrales. ? Un cuarto del plato tiene protena, especialmente carnes [place], aves, huevos, tofu, frijoles o frutos secos.  Incluya lcteos descremados en su dieta diaria.   Estilo de vida  Elija opciones saludables en todos los mbitos, como en el hogar, el [place], la [place], los restaurantes y [place].  Prepare los alimentos de un modo seguro: ? Lvese las manos despus de manipular carnes crudas. ? Mantenga las superficies de preparacin de los alimentos limpias lavndolas regularmente con agua caliente y [place]. ? Mantenga las carnes crudas separadas de los alimentos que estn listos para comer como las frutas y las verduras. ? Cocine los frutos de mar, carnes, aves y  Production manager la temperatura interna recomendada. ? Almacene los alimentos a temperaturas seguras. En general:  Mantenga los alimentos fros a una temperatura de 33F (4,4C) o inferior.  Mantenga los alimentos calientes a una temperatura de 133F (60C) o superior.  Mantenga el congelador a una temperatura de 0 F (-17,8C) o inferior.  Los alimentos dejan de ser seguros para su consumo cuando han estado a una temperatura de entre 40 y 133F (4,4 y 60C) por ms de 2horas. Qu alimentos debo consumir? Frutas Propngase comer el equivalente a 2tazas de frutas frescas, enlatadas (en su jugo natural) o Primary school teacher. Algunos ejemplos de equivalentes a 1taza de frutas son pequea, 19fresas grandes, 1taza de fruta enlatada, taza de fruta desecada o 1 taza de jugo 100%. Verduras Propngase comer el equivalente a 2 o 3tazas de verduras frescas y Primary school teacher, incluyendo diferentes variedades y colores. Algunos ejemplos de equivalentes a 1taza de verduras son 2zanahorias medianas, 2 tazas de verduras de hoja verde crudas, 1taza de verduras cortadas (crudas o cocidas) o 1papa mediana al horno. Granos Propngase comer el equivalente a 6onzas de Licensed conveyancer. Algunos ejemplos de equivalentes a 1onza de cereales son [place] de pan, 1taza de cereal listo para comer, 3tazas de palomitas de maz o  taza de arroz, pasta o cereales cocidos. Carnes y otras protenas Propngase comer el equivalente a 5 o 6onzas de Publishing copy. Algunos ejemplos de equivalentes a 1 onza de protena son 1huevo, taza de frutos secos o semillas o 1 cucharada (16g)  de [place] de man. Un corte de carne o pescado del tamao de un mazo de cartas equivale aproximadamente a 3 a 4 onzas.  De las protenas que consume cada semana, intente que al menos 8onzas provengan de frutos de mar. Esto incluye el salmn, la [place], el arenque y las  [place]. Lcteos Propngase comer el equivalente a 3tazas de lcteos descremados o con bajo contenido de Museum/gallery curator. Algunos ejemplos de equivalentes a 1taza de lcteos son 1taza ( ) de [place], 8onzas (250g) de Dentist, 1onzas (44g) de queso natural o 1 taza ( ) de Teaching laboratory technician de soja fortificada. Grasas y aceites  Propngase consumir alrededor de 5 cucharaditas (21g) por da. Elija grasas monoinsaturadas, como el aceite de canola y de [place], [place], [place] de man y Games developer de los frutos secos, o bien grasas poliinsaturadas, como el aceite de [place], maz y soja, nueces, piones, semillas de ssamo, semillas de girasol y semillas de lino. Bebidas  Propngase beber seis vasos de 8 onzas de Warehouse manager. Limite el caf a entre tres y cinco tazas de 8 onzas por Futures trader.  Limite el consumo de bebidas con cafena que tengan caloras agregadas, como los refrescos y las bebidas energizantes.  Limite el consumo de alcohol a no ms de por da si es mujer y no est [place], y a por da si es hombre. Una medida equivale a 12onzas de cerveza ( ), 5onzas de vino ( ) o 1onzas de bebidas alcohlicas de alta graduacin (39ml). Condimentos y otros Art gallery manager cantidades excesivas de sal a los alimentos. Pruebe darles sabor con hierbas y especias en lugar de sal.  Evite agregar azcar a los alimentos.  Pruebe usar alios, salsas y productos untables a base de Research scientist (life sciences) de grasas slidas. Esta informacin se basa en las pautas generales de nutricin de los [place] Para obtener ms informacin, visite [email or web address]. Las cantidades exactas pueden variar en funcin de sus necesidades nutricionales. Resumen  Un plan de alimentacin saludable puede ayudarlo a mantener un peso saludable, reducir el riesgo de tener enfermedades crnicas y [place] activo durante toda su vida.  Planifique sus comidas. Asegrese de consumir las  porciones correctas de una variedad de alimentos ricos en nutrientes.  En lugar de frer, trate de cocinar en el horno, en la plancha o en la parrilla, o hervir los alimentos.  Elija opciones saludables en todos los mbitos, como en el hogar, el trabajo, la [place], los restaurantes y [place]. Esta informacin no tiene Theme park manager el consejo del mdico. Asegrese de hacerle al mdico cualquier pregunta que tenga. Document Revised: 09/05/2017 Document Reviewed: 09/05/2017 Elsevier Patient Education  2021 [employer].

## 2020-08-15 ENCOUNTER — Telehealth: Payer: Self-pay | Admitting: Internal Medicine

## 2020-08-15 LAB — CBC
Hematocrit: 40.3 % (ref 34.0–46.6)
Hemoglobin: 13.4 g/dL (ref 11.1–15.9)
MCH: 30.2 pg (ref 26.6–33.0)
MCHC: 33.3 g/dL (ref 31.5–35.7)
MCV: 91 fL (ref 79–97)
Platelets: 303 10*3/uL (ref 150–450)
RBC: 4.44 x10E6/uL (ref 3.77–5.28)
RDW: 12.9 % (ref 11.7–15.4)
WBC: 5.5 10*3/uL (ref 3.4–10.8)

## 2020-08-15 LAB — COMPREHENSIVE METABOLIC PANEL
ALT: 30 IU/L (ref 0–32)
AST: 22 IU/L (ref 0–40)
Albumin/Globulin Ratio: 1.5 (ref 1.2–2.2)
Albumin: 4.6 g/dL (ref 3.8–4.8)
Alkaline Phosphatase: 103 IU/L (ref 44–121)
BUN/Creatinine Ratio: 17 (ref 9–23)
BUN: 12 mg/dL (ref 6–24)
Bilirubin Total: 0.5 mg/dL (ref 0.0–1.2)
CO2: 20 mmol/L (ref 20–29)
Calcium: 9.5 mg/dL (ref 8.7–10.2)
Chloride: 106 mmol/L (ref 96–106)
Creatinine, Ser: 0.69 mg/dL (ref 0.57–1.00)
Globulin, Total: 3 g/dL (ref 1.5–4.5)
Glucose: 86 mg/dL (ref 65–99)
Potassium: 5.3 mmol/L — ABNORMAL HIGH (ref 3.5–5.2)
Sodium: 140 mmol/L (ref 134–144)
Total Protein: 7.6 g/dL (ref 6.0–8.5)
eGFR: 108 mL/min/{1.73_m2} (ref >59)

## 2020-08-15 LAB — LIPID PANEL
Chol/HDL Ratio: 3.4 ratio (ref 0.0–4.4)
Cholesterol, Total: 218 mg/dL — ABNORMAL HIGH (ref 100–199)
HDL: 65 mg/dL (ref >39)
LDL Chol Calc (NIH): 139 mg/dL — ABNORMAL HIGH (ref 0–99)
Triglycerides: 79 mg/dL (ref 0–149)
VLDL Cholesterol Cal: 14 mg/dL (ref 5–40)

## 2020-08-15 LAB — HEMOGLOBIN A1C
Est. average glucose Bld gHb Est-mCnc: 111 mg/dL
Hgb A1c MFr Bld: 5.5 % (ref 4.8–5.6)

## 2020-08-15 NOTE — Telephone Encounter (Signed)
Phone call placed to patient today to go over lab results.  The phone rang for a long time then I got a message stating that my phone call cannot be completed at this time and to try again later.  No answer on home phone.

## 2020-08-16 ENCOUNTER — Other Ambulatory Visit: Payer: Self-pay | Admitting: Internal Medicine

## 2020-08-16 ENCOUNTER — Telehealth: Payer: Self-pay | Admitting: Internal Medicine

## 2020-08-16 DIAGNOSIS — E875 Hyperkalemia: Secondary | ICD-10-CM

## 2020-08-16 NOTE — Telephone Encounter (Signed)
Phone call placed to patient today to go over lab results.  I got a message that the phone call cannot be completed at this time and to try to call again later.

## 2020-08-16 NOTE — Progress Notes (Signed)
Let patient know that her blood cell counts are normal.  Potassium level is mildly elevated.  She should cut back on the amount of potassium rich foods that she eats.  Foods that are high in potassium include oranges, orange juice, bananas, cantaloupes.  Please return to the lab in 1 week for repeat potassium level check.  Cholesterol level is 139 with goal being less than 100.  Healthy eating habits and regular exercise will help to lower cholesterol.

## 2020-08-18 ENCOUNTER — Telehealth: Payer: Self-pay

## 2020-08-18 NOTE — Telephone Encounter (Signed)
Contacted pt to go over lab results pt didn't answer lvm  

## 2020-09-01 ENCOUNTER — Other Ambulatory Visit: Payer: Self-pay

## 2020-09-01 ENCOUNTER — Ambulatory Visit: Payer: BC Managed Care – PPO | Attending: Internal Medicine

## 2020-09-01 DIAGNOSIS — E875 Hyperkalemia: Secondary | ICD-10-CM

## 2020-09-02 ENCOUNTER — Telehealth: Payer: Self-pay

## 2020-09-02 LAB — POTASSIUM: Potassium: 4.4 mmol/L (ref 3.5–5.2)

## 2020-09-02 NOTE — Telephone Encounter (Signed)
Contacted pt to go over lab results pt is aware and doesn't have any questions or concerns 

## 2020-10-07 ENCOUNTER — Ambulatory Visit: Payer: BC Managed Care – PPO | Attending: Internal Medicine | Admitting: Internal Medicine

## 2020-10-07 ENCOUNTER — Other Ambulatory Visit: Payer: Self-pay | Admitting: Internal Medicine

## 2020-10-07 ENCOUNTER — Other Ambulatory Visit (HOSPITAL_COMMUNITY)
Admission: RE | Admit: 2020-10-07 | Discharge: 2020-10-07 | Disposition: A | Discharge status: Home / Self Care | Payer: BC Managed Care – PPO | Source: Ambulatory Visit | Attending: Internal Medicine | Admitting: Internal Medicine

## 2020-10-07 ENCOUNTER — Other Ambulatory Visit: Payer: Self-pay

## 2020-10-07 ENCOUNTER — Encounter: Payer: Self-pay | Admitting: Internal Medicine

## 2020-10-07 VITALS — BP 108/76 | HR 80 | Resp 16 | Wt 182.4 lb

## 2020-10-07 DIAGNOSIS — Z124 Encounter for screening for malignant neoplasm of cervix: Secondary | ICD-10-CM

## 2020-10-07 DIAGNOSIS — Z1159 Encounter for screening for other viral diseases: Secondary | ICD-10-CM

## 2020-10-07 DIAGNOSIS — R232 Flushing: Secondary | ICD-10-CM

## 2020-10-07 DIAGNOSIS — Z1231 Encounter for screening mammogram for malignant neoplasm of breast: Secondary | ICD-10-CM

## 2020-10-07 DIAGNOSIS — Z1211 Encounter for screening for malignant neoplasm of colon: Secondary | ICD-10-CM

## 2020-10-07 MED ORDER — VENLAFAXINE HCL ER 37.5 MG PO CP24
37.5000 mg | ORAL_CAPSULE | Freq: Every day | ORAL | 1 refills | Status: DC
  Filled 2020-10-07: qty 30, 30d supply, fill #0

## 2020-10-07 NOTE — Progress Notes (Signed)
Patient ID: Yesenia Gibson, female    DOB: May 15, 1970  MRN: [id number]  CC: Gynecologic Exam   Subjective: Yesenia Gibson is a 50 y.o. female who presents for Pap smear Her concerns today include: Panic attacks, seasonal allergies, HL, obesity, perimenopausal  Pt is G3P2(one miscarriage -ectopic pregnancy) No abnormal PAPs in past Some vaginal dryness and white discgh Active with husband only. Uses condoms. Declines STI screening Last menses 05/09/2020.  C/o bothersome hot flashes a lot.  Given info last visit on perimenopausal. Uses Estroven OTC which helps.   No fhx of breast or ovarian cancer. Had MMG in 2020 that showed a possible benign asymmetry in the right breast.  She was supposed to have right breast diagnostic mammogram and ultrasound in 6 months to ensure stability.  Never did have follow-up mammogram or ultrasound.   Patient Active Problem List   Diagnosis Date Noted   Obesity (BMI 30.0-34.9) 08/14/2020   Perimenopausal 08/14/2020   COVID-19 vaccination declined 08/14/2020   Health care maintenance 09/28/2016   Seasonal allergic rhinitis due to pollen 09/07/2016   Anxiety disorder 09/07/2016     Current Outpatient Medications on File Prior to Visit  Medication Sig Dispense Refill   acetaminophen (TYLENOL) 500 MG tablet Take 500 mg by mouth every 6 (six) hours as needed. For pain     No current facility-administered medications on file prior to visit.    No Known Allergies  Social History   Socioeconomic History   Marital status: Single    Spouse name: Not on file   Number of children: 2   Years of education: 9   Highest education level: Not on file  Occupational History   Occupation: house keeping  Tobacco Use   Smoking status: Never   Smokeless tobacco: Never  Vaping Use   Vaping Use: Never used  Substance and Sexual Activity   Alcohol use: No   Drug use: No   Sexual activity: Yes    Birth control/protection: None  Other Topics Concern    Not on file  Social History Narrative   Not on file   Social Determinants of Health   Financial Resource Strain: Not on file  Food Insecurity: Not on file  Transportation Needs: Not on file  Physical Activity: Not on file  Stress: Not on file  Social Connections: Not on file  Intimate Partner Violence: Not on file    Family History  Problem Relation Age of Onset   Anesthesia problems Neg Hx    Breast cancer Neg Hx     Past Surgical History:  Procedure Laterality Date   AUGMENTATION MAMMAPLASTY Bilateral 2011   saline implants   AUGMENTATION MAMMAPLASTY Bilateral 02/2018   explants    BREAST ENHANCEMENT SURGERY      ROS: Review of Systems Negative except as stated above  PHYSICAL EXAM: BP 108/76   Pulse 80   Resp 16   Wt 182 lb 6.4 oz (82.7 kg)   SpO2 97%   BMI 30.35 kg/m   Physical Exam  General appearance - alert, well appearing, and in no distress Mental status - normal mood, behavior, speech, dress, motor activity, and thought processes Breasts -CMA Pollock present and observed breast exam and pelvic exam: Breasts appear normal, no suspicious masses, no skin or nipple changes or axillary nodes Pelvic - normal external genitalia, vulva, vagina, cervix, uterus and adnexa.  No cervical motion tenderness or adnexal masses.  Uterus felt normal in size.   Depression screen [facility] 2/9  10/07/2020 08/14/2020 09/28/2016  Decreased Interest 1 1 1   Down, Depressed, Hopeless 0 0 3  PHQ - 2 Score 1 1 4   Altered sleeping - - 2  Tired, decreased energy - - 2  Change in appetite - - 2  Feeling bad or failure about yourself  - - 1  Trouble concentrating - - 1  Moving slowly or fidgety/restless - - 0  Suicidal thoughts - - 0  PHQ-9 Score - - 12  Difficult doing work/chores - - -    CMP Latest Ref Rng & Units 09/01/2020 08/14/2020 09/08/2016  Glucose 65 - 99 mg/dL - 86 -  BUN 6 - 24 mg/dL - 12 -  Creatinine 08/16/2020 - 1.00 mg/dL - 09/10/2016 -  Sodium 5.46 - 144 mmol/L - 140 -   Potassium 3.5 - 5.2 mmol/L 4.4 5.3(H) 4.7  Chloride 96 - 106 mmol/L - 106 -  CO2 20 - 29 mmol/L - 20 -  Calcium 8.7 - 10.2 mg/dL - 9.5 -  Total Protein 6.0 - 8.5 g/dL - 7.6 -  Total Bilirubin 0.0 - 1.2 mg/dL - 0.5 -  Alkaline Phos 44 - 121 IU/L - 103 -  AST 0 - 40 IU/L - 22 -  ALT 0 - 32 IU/L - 30 -   Lipid Panel     Component Value Date/Time   CHOL 218 (H) 08/14/2020 1129   TRIG 79 08/14/2020 1129   HDL 65 08/14/2020 1129   CHOLHDL 3.4 08/14/2020 1129   CHOLHDL 3.3 Ratio 09/25/2007 2030   VLDL 20 09/25/2007 2030   LDLCALC 139 (H) 08/14/2020 1129    CBC    Component Value Date/Time   WBC 5.5 08/14/2020 1129   WBC 9.8 07/24/2011 2155   RBC 4.44 08/14/2020 1129   RBC 4.02 07/24/2011 2155   HGB 13.4 08/14/2020 1129   HCT 40.3 08/14/2020 1129   PLT 303 08/14/2020 1129   MCV 91 08/14/2020 1129   MCH 30.2 08/14/2020 1129   MCH 31.1 07/24/2011 2155   MCHC 33.3 08/14/2020 1129   MCHC 34.2 07/24/2011 2155   RDW 12.9 08/14/2020 1129   LYMPHSABS 2.3 09/25/2007 2030   MONOABS 0.3 09/25/2007 2030   EOSABS 0.1 09/25/2007 2030   BASOSABS 0.0 09/25/2007 2030    ASSESSMENT AND PLAN: 1. Pap smear for cervical cancer screening - Cytology - PAP  2. Hot flashes Discussed management of hot flashes.  We can use hormone replacement therapy but I would be skeptical to do so given that last mammogram done 2 years ago was BI-RADS Category 3 and she has not had follow-up on it.  We discussed other nonhormonal options like gabapentin or use of low-dose Paxil or Effexor.  Advised that the latter I use primarily to treat depression and anxiety but have been shown to decrease hot flashes.  She is willing to try 1 of these agents.  We will try her with the Effexor.  Referral submitted for her to get mammogram done - Luteinizing hormone - Follicle stimulating hormone - venlafaxine XR (EFFEXOR XR) 37.5 MG 24 hr capsule; Take 1 capsule (37.5 mg total) by mouth daily with breakfast.  Dispense: 30  capsule; Refill: 1  3. Encounter for screening mammogram for malignant neoplasm of breast - MM Digital Screening; Future  4. Screening for colon cancer Patient agreeable to colon cancer screening - Fecal occult blood, imunochemical([facility]/Sunquest)  5. Need for hepatitis C screening test Patient agreeable to hepatitis C screening - Hepatitis C Antibody  Patient was given the opportunity to ask questions.  Patient verbalized understanding of the plan and was able to repeat key elements of the plan.  [employer] Language interpreter used during this encounter. #[id number], Hanley Hays  Orders Placed This Encounter  Procedures   Fecal occult blood, imunochemical([facility]/Sunquest)   MM Digital Screening   Hepatitis C Antibody   Luteinizing hormone   Follicle stimulating hormone     Requested Prescriptions   Signed Prescriptions Disp Refills   venlafaxine XR (EFFEXOR XR) 37.5 MG 24 hr capsule 30 capsule 1    Sig: Take 1 capsule (37.5 mg total) by mouth daily with breakfast.    No follow-ups on file.  Jonah Blue, MD, FACP

## 2020-10-08 ENCOUNTER — Other Ambulatory Visit: Payer: Self-pay | Admitting: Internal Medicine

## 2020-10-08 LAB — CYTOLOGY - PAP
Comment: NEGATIVE
Diagnosis: NEGATIVE
High risk HPV: NEGATIVE

## 2020-10-08 LAB — LUTEINIZING HORMONE: LH: 82.1 m[IU]/mL

## 2020-10-08 LAB — FOLLICLE STIMULATING HORMONE: FSH: 99.4 m[IU]/mL

## 2020-10-08 LAB — HEPATITIS C ANTIBODY: Hep C Virus Ab: <0.1 s/co ratio (ref 0.0–0.9)

## 2020-10-09 ENCOUNTER — Telehealth: Payer: Self-pay

## 2020-10-09 NOTE — Telephone Encounter (Signed)
Contacted pt to go over pap results pt is aware and doesn't have any questions or concerns  

## 2020-10-10 ENCOUNTER — Other Ambulatory Visit: Payer: Self-pay

## 2020-10-11 LAB — FECAL OCCULT BLOOD, IMMUNOCHEMICAL: Fecal Occult Bld: NEGATIVE

## 2020-10-15 ENCOUNTER — Other Ambulatory Visit: Payer: Self-pay | Admitting: Internal Medicine

## 2020-10-15 DIAGNOSIS — Z09 Encounter for follow-up examination after completed treatment for conditions other than malignant neoplasm: Secondary | ICD-10-CM

## 2020-10-16 ENCOUNTER — Telehealth: Payer: Self-pay

## 2020-10-16 NOTE — Telephone Encounter (Signed)
Contacted pt to go over lab results pt didn't answer lvm  

## 2020-10-20 ENCOUNTER — Encounter: Payer: Self-pay | Admitting: *Deleted

## 2020-10-29 ENCOUNTER — Telehealth: Payer: Self-pay | Admitting: *Deleted

## 2020-10-29 NOTE — Telephone Encounter (Signed)
Reviewed latest lab results and physician's note with the patient via interpreter. No questions asked.

## 2021-03-16 IMAGING — MG DIGITAL DIAGNOSTIC BILATERAL MAMMOGRAM WITH TOMO AND CAD
8 series · 8 of 24 positions shown · non-contrast
Comparison: Previous exam(s).

CLINICAL DATA: Patient with recent bilateral implant removals.
Patient has tenderness within the periareolar right and left breast.

EXAM:
DIGITAL DIAGNOSTIC BILATERAL MAMMOGRAM WITH CAD AND TOMO
ULTRASOUND BILATERAL BREAST

[R MLO synth-2D]
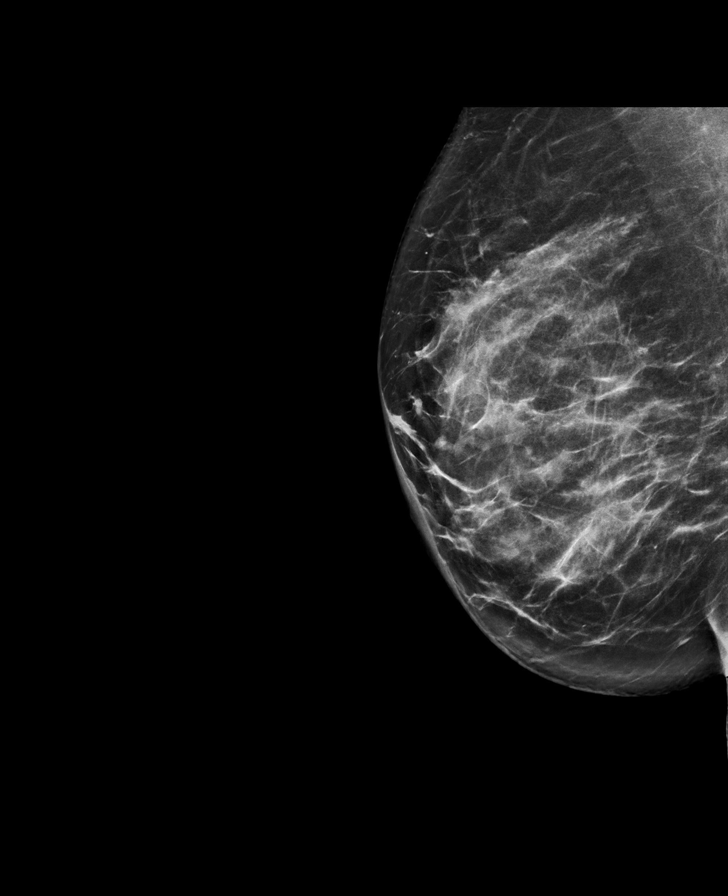

[R CC synth-2D]
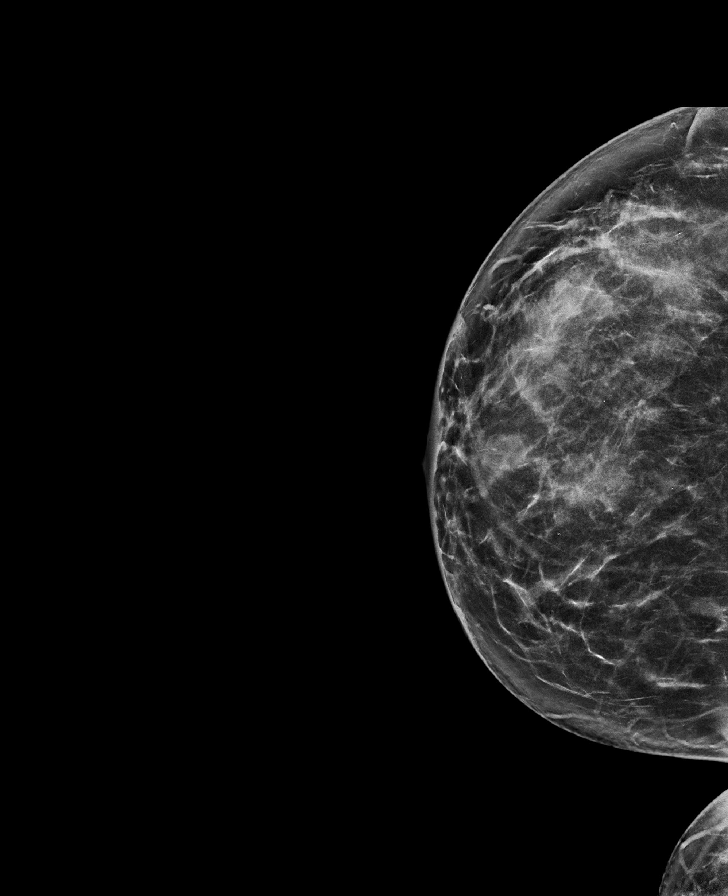

[L CC synth-2D]
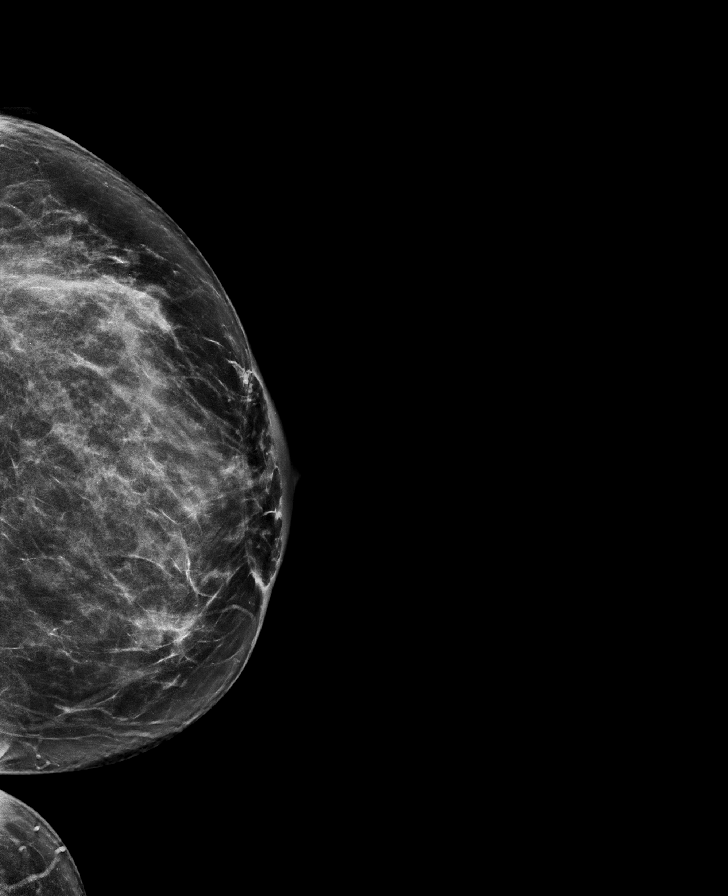

[L MLO synth-2D]
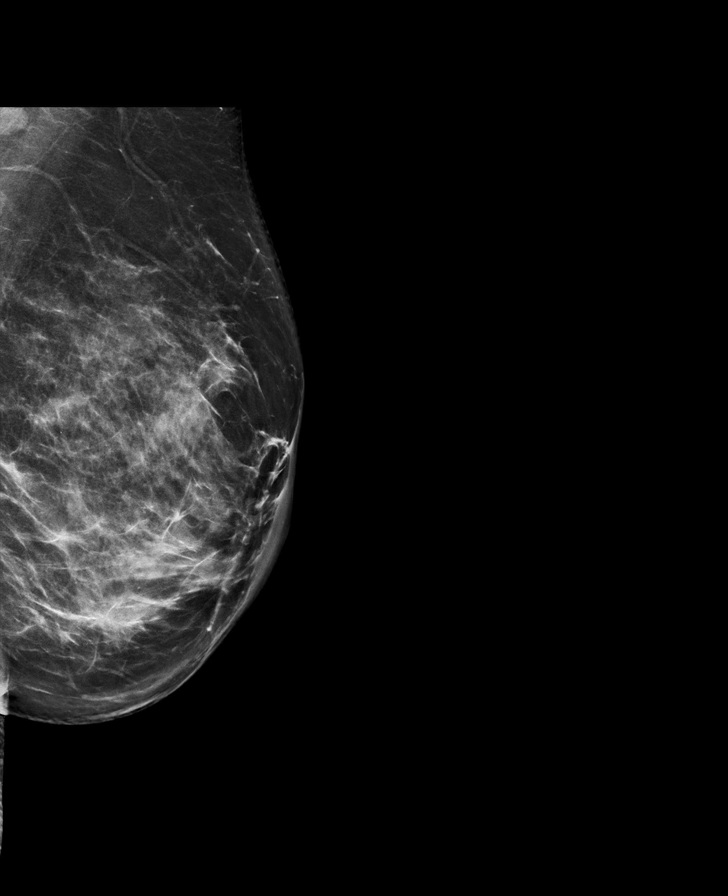

[R CC tomo · tomo slice 39/77.0]
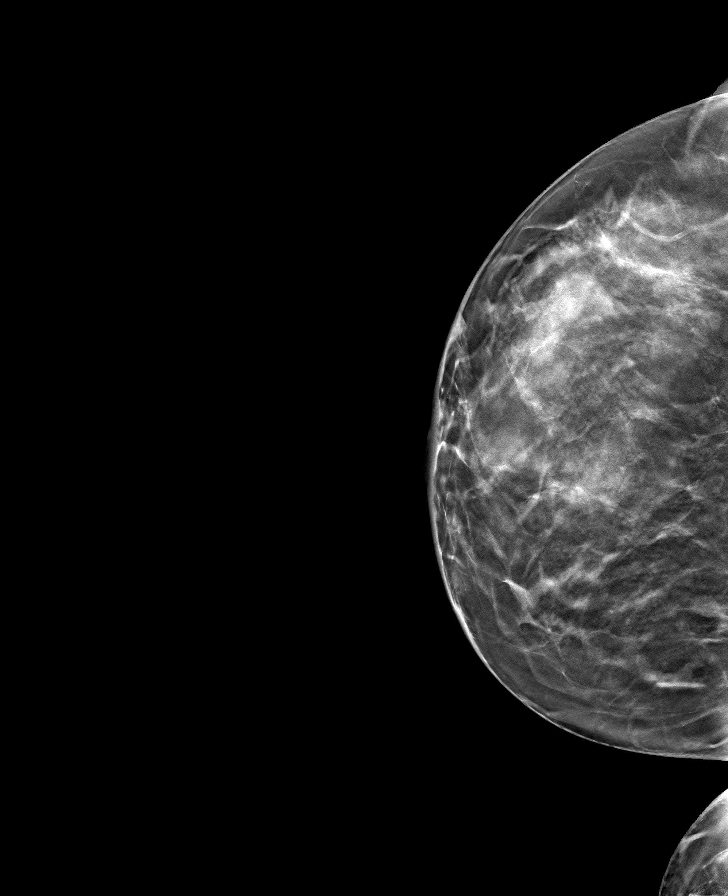

[L CC tomo · tomo slice 41/80.0]
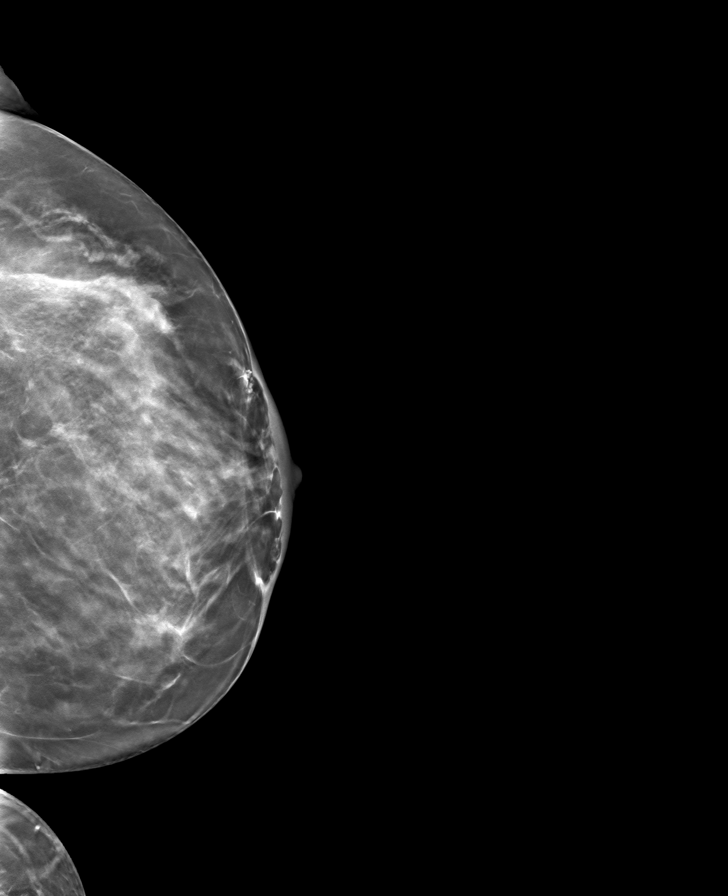

[L MLO tomo · tomo slice 45/88.0]
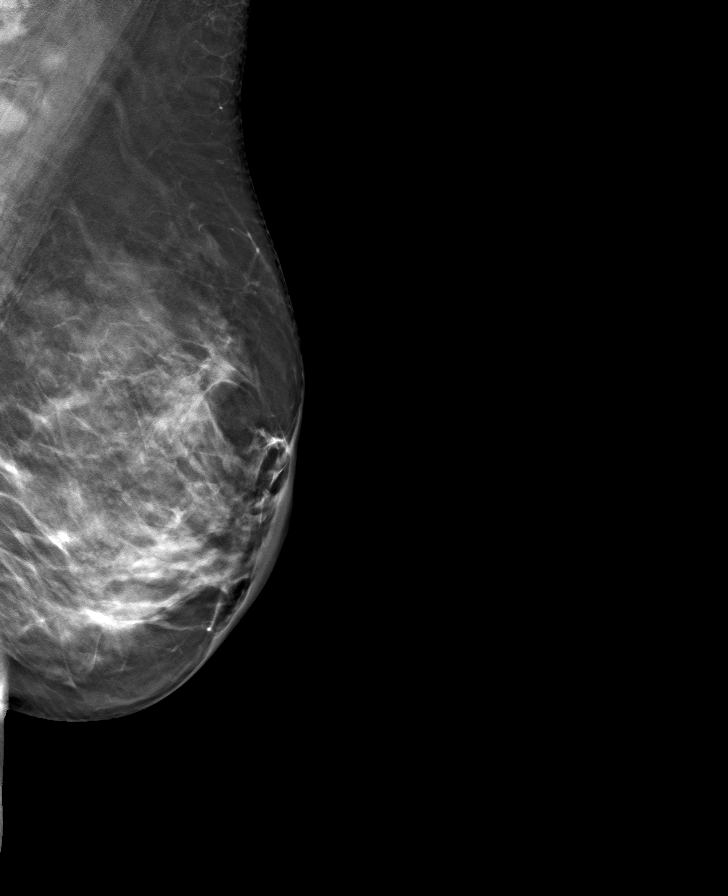

[R MLO tomo · tomo slice 42/83.0]
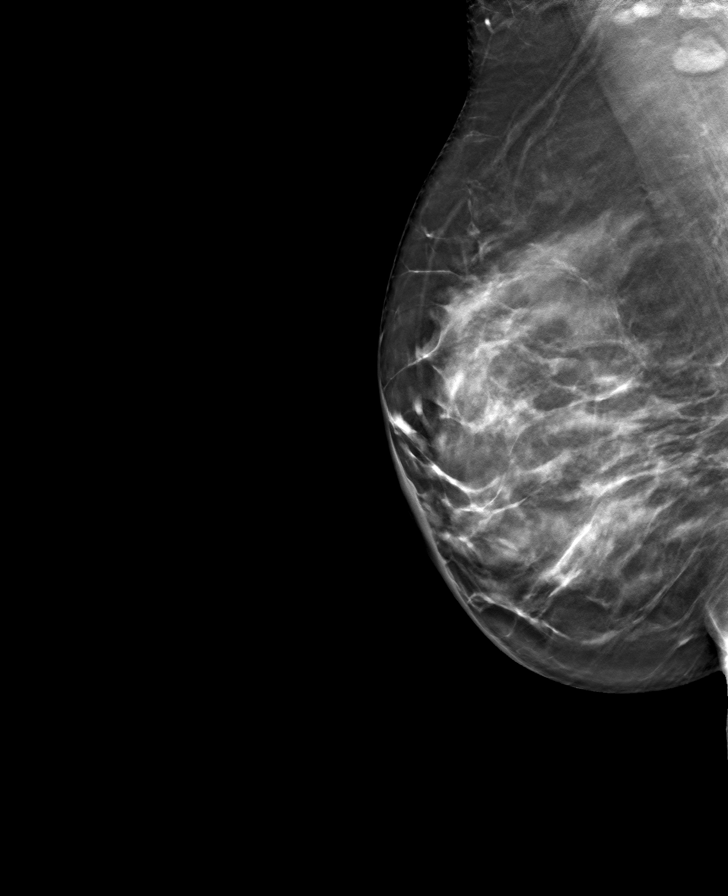

[8 of 24 positions shown; findings below may reference images not displayed]

ACR Breast Density Category c: The breast tissue is heterogeneously
dense, which may obscure small masses.
FINDINGS: Interval removal of the bilateral saline implants. Postsurgical
changes identified within the right and left breast. There is
asymmetric density within the posteromedial right breast, upper
outer right breast and medial left breast. No additional masses,
calcifications or nonsurgical distortion identified within either
breast.

Mammographic images were processed with CAD.

On physical exam, there is mild redness involving the scar sites in
the periareolar right and left breast. This is favored to be
postsurgical in etiology.

Targeted ultrasound is performed, showing normal tissue within the
left breast 9 o'clock position 3 cm from the nipple. No suspicious
mass identified.

Within the right breast 10 o'clock position 3 cm from nipple there
is normal dense fibroglandular tissue.

Within the right breast 2 o'clock position 6 cm from nipple there is
normal dense fibroglandular tissue.
IMPRESSION: Probably benign focal asymmetry far posteromedial right breast,
potentially postsurgical in etiology.

No additional concerning findings within either breast.

RECOMMENDATION:
Right breast diagnostic mammogram and possible ultrasound of
probably benign asymmetry in 6 months to ensure stability
posteromedial right breast.

I have discussed the findings and recommendations with the patient.
Results were also provided in writing at the conclusion of the
visit. If applicable, a reminder letter will be sent to the patient
regarding the next appointment.

BI-RADS CATEGORY  3: Probably benign.

## 2022-02-15 ENCOUNTER — Ambulatory Visit: Payer: Self-pay | Admitting: Internal Medicine

## 2022-03-08 ENCOUNTER — Ambulatory Visit: Payer: Self-pay | Admitting: Family Medicine

## 2022-03-08 ENCOUNTER — Telehealth: Payer: Self-pay | Admitting: Internal Medicine

## 2022-03-08 NOTE — Telephone Encounter (Signed)
Pt was a no show for a NP app with Dr Janee Morn on 03/08/22, I sent a letter.

## 2022-03-23 NOTE — Telephone Encounter (Signed)
1st no show, fee waived, letter sent 

## 2022-06-28 ENCOUNTER — Encounter: Payer: Self-pay | Admitting: Internal Medicine

## 2022-06-28 ENCOUNTER — Ambulatory Visit: Payer: Commercial Managed Care - PPO | Attending: Internal Medicine | Admitting: Internal Medicine

## 2022-06-28 VITALS — BP 119/69 | HR 80 | Temp 98.4°F | Ht 65.0 in | Wt 196.0 lb

## 2022-06-28 DIAGNOSIS — R232 Flushing: Secondary | ICD-10-CM | POA: Diagnosis not present

## 2022-06-28 DIAGNOSIS — N950 Postmenopausal bleeding: Secondary | ICD-10-CM | POA: Diagnosis not present

## 2022-06-28 DIAGNOSIS — Z1211 Encounter for screening for malignant neoplasm of colon: Secondary | ICD-10-CM

## 2022-06-28 DIAGNOSIS — Z8639 Personal history of other endocrine, nutritional and metabolic disease: Secondary | ICD-10-CM

## 2022-06-28 DIAGNOSIS — Z1231 Encounter for screening mammogram for malignant neoplasm of breast: Secondary | ICD-10-CM

## 2022-06-28 DIAGNOSIS — E669 Obesity, unspecified: Secondary | ICD-10-CM | POA: Diagnosis not present

## 2022-06-28 DIAGNOSIS — M2550 Pain in unspecified joint: Secondary | ICD-10-CM

## 2022-06-28 NOTE — Patient Instructions (Signed)
Alimentacin saludable en los [employer], Adult Una alimentacin saludable puede ayudarlo a Science writer y Theatre manager un peso saludable, reducir el riesgo de tener enfermedades crnicas y vivir Ardelia Mems vida larga y productiva. Es importante que siga una modalidad de alimentacin saludable. Sus necesidades nutricionales y calricas deben satisfacerse principalmente con distintos alimentos ricos en nutrientes. Consejos para seguir Catering manager Lea las etiquetas de los alimentos Lea las etiquetas y elija las que digan lo siguiente: Productos reducidos en sodio o con bajo contenido de [place]. Jugos con 100 % jugo de fruta. Alimentos con bajo contenido de grasas saturadas (menos de 3 g por porcin) y alto contenido de grasas poliinsaturadas y Veterinary surgeon. Alimentos con cereales integrales, como trigo integral, trigo partido, arroz integral y arroz salvaje. Cereales integrales fortificados con cido flico. Esto se recomienda a las mujeres embarazadas o que desean quedar embarazadas. Lea las etiquetas y no coma ni beba lo siguiente: Alimentos o bebidas con azcar agregada. Estos incluyen los alimentos que contienen azcar moreno, endulzante a base de maz, jarabe de maz, dextrosa, fructosa, glucosa, jarabe de maz de alta fructosa, miel, azcar invertido, lactosa, jarabe de [place], maltosa, Sammy Martinez, azcar sin refinar, sacarosa, trehalosa y azcar turbinado. Limite el consumo de azcar agregada a menos del 10 % del total de caloras diarias. No consuma ms que las siguientes cantidades de azcar agregada por da: 6 cucharaditas (25 g) para las mujeres. 9 cucharaditas (38 g) para los hombres. Los alimentos que contienen almidones y cereales refinados o procesados. Los productos de cereales refinados, como harina blanca, harina de maz desgerminada, pan blanco y arroz blanco. Al ir de compras Elija refrigerios ricos en nutrientes, como verduras, frutas enteras y frutos secos. Evite los refrigerios con  alto contenido de caloras y Location manager, como las papas fritas, los refrigerios frutales y los caramelos. Use alios y productos para untar a base de aceite con los Building surveyor de grasas slidas como la [place], la [place], la crema agria o el queso crema. Limite las salsas, las mezclas y los productos "instantneos" preelaborados como el arroz saborizado, los fideos instantneos y las pastas listas para comer. Pruebe ms fuentes de protena vegetal, como tofu, tempeh, frijoles negros, edamame, lentejas, frutos secos y semillas. Explore planes de alimentacin como la dieta mediterrnea o la dieta vegetariana. Pruebe salsas cardiosaludables hechas con frijoles y grasas saludables, como hummus y [place]. Las verduras van muy bien con ellas. Al cocinar Use aceite para Lobbyist de grasas slidas como [place], margarina o [place] de [place]. En lugar de frer, trate de cocinar en el horno, en la plancha o en la parrilla, o hervir los alimentos. Retire la parte grasa de las carnes antes de cocinarlas. Cocine las verduras al vapor en agua o caldo. Planificacin de las comidas  En las comidas, imagine dividir su plato en cuartos: La mitad del plato tiene frutas y verduras. Un cuarto del plato tiene cereales integrales. Un cuarto del plato tiene protena, especialmente carnes [place], aves, huevos, tofu, frijoles o frutos secos. Incluya lcteos descremados en su dieta diaria. Estilo de vida Elija opciones saludables en todos los mbitos, como en el hogar, el [place], la [place], los restaurantes y [place]. Prepare los alimentos de un modo seguro: Lvese las manos despus de manipular carnes crudas. Donde prepare alimentos, mantenga las superficies limpias lavndolas regularmente con agua caliente y [place]. Mantenga las carnes crudas separadas de los alimentos que estn listos para comer como las frutas y las verduras. Cocine los frutos  de mar, carnes, aves y Musician la temperatura recomendada. Consiga un termmetro para alimentos. Almacene los alimentos a temperaturas seguras. En general: Mantenga los alimentos fros a una temperatura de 40 F (4,4 C) o inferior. Mantenga los alimentos calientes a una temperatura de 140 F (60 C) o superior. Mantenga el congelador a una temperatura de 0 F (-17,8 C) o inferior. Los alimentos no son seguros para su consumo cuando han estado a una temperatura de entre 19 y 38 F (4.4 y 73 C) por ms de 2 horas. Qu alimentos debo comer? Frutas Propngase comer entre 1 y 2 tazas de frutas frescas, IT sales professional (en su jugo natural) o Wellsite geologist. Una taza de fruta equivale a 1 manzana pequea, 1 banana grande, 8 fresas grandes, 1 taza (237 g) de fruta enlatada,  taza (82 g) de fruta seca o 1 taza (240 ml) de jugo al 100 %. Verduras Propngase comer de 2 a 4 tazas de verduras frescas y Wellsite geologist, incluyendo diferentes variedades y colores. Una taza de verduras equivale a 1 taza (91 g) de brcoli o coliflor, 2 zanahorias medianas, 2 tazas (150 g) de verduras de [employer] crudas, 1 tomate grande, 1 pimiento morrn grande, 1 batata grande o 1 patata blanca mediana. Cereales Propngase comer el equivalente a entre 4 y 5 onzas de cereales integrales por Training and development officer. Algunos ejemplos de equivalentes a 1 onza de cereales son 1 rebanada de pan, 1 taza (40 g) de cereal listo para comer, 3 tazas (24 g) de palomitas de maz o  taza (93 g) de arroz cocido. Carnes y otras protenas Propngase comer el equivalente a entre 5 y 1  onzas de protena por Training and development officer. Algunos ejemplos de equivalentes a 1 onza de protenas incluyen 1 huevo,  oz de frutos secos (12 almendras, 24 pistachos o 7 mitades de nueces), 1/4 taza (90 g) de frijoles cocidos, 6 cucharadas (90 g) de hummus o 1 cucharada (16 g) de [place] de man. Un corte de carne o pescado del tamao de un mazo de cartas equivale aproximadamente a 3 a 4 onzas (85  g). De las protenas que consume cada semana, intente que al menos 8 onzas (227 g) sean frutos de mar. Esto equivale a unas 2 porciones por semana. Esto incluye salmn, trucha, arenque y anchoas. Lcteos DIRECTV a 3 tazas de lcteos descremados o con bajo contenido de Public librarian. Algunos ejemplos de equivalentes a 1 taza de lcteos son 1 taza (240 ml) de leche, 8 onzas (250 g) de yogur, 1 onzas (44 g) de queso natural o 1 taza (240 ml) de leche de soja fortificada. Grasas y aceites Propngase consumir alrededor de 5 cucharaditas (21 g) de grasas y Science writer. Elija grasas monoinsaturadas, como el aceite de [place]ola y de [place], la [place] con aceite de [place] o de [place], [place], [place] de man y Media planner de los frutos secos, o bien grasas poliinsaturadas, como el aceite de [place], maz y soja, nueces, piones, semillas de ssamo, semillas de girasol y semillas de lino. Bebidas Propngase beber 6 vasos de 8 onzas de Public affairs consultant. Limite el caf a entre 3 y 41 tazas de ocho onzas por Training and development officer. Limite el consumo de bebidas con cafena que tengan caloras agregadas, como los refrescos y las bebidas energizantes. Si bebe alcohol: Limite la cantidad que bebe a lo siguiente: De 0 a 1 medida al da si es [place]. De 0 a 2 medidas  al da si es varn. Sepa cunta cantidad de alcohol hay en las bebidas que toma. En los [place], una medida es una botella de cerveza de 12 oz (355 ml), un vaso de vino de 5 oz (148 ml) o un vaso de una bebida alcohlica de alta graduacin de 1 oz (44 ml). Condimentos y otros alimentos Trate de no agregar demasiada sal a los alimentos. Trate de usar hierbas y especias en lugar de sal. Trate de no agregar azcar a los alimentos. Esta informacin se basa en las pautas de nutricin de los [place] Para obtener ms informacin, visite [email or web address].es. Las Dealer. Es posible que necesite cantidades  diferentes. Esta informacin no tiene Marine scientist el consejo del mdico. Asegrese de hacerle al mdico cualquier pregunta que tenga. Document Revised: 02/09/2022 Document Reviewed: 02/09/2022 Elsevier Patient Education  [place].

## 2022-06-28 NOTE — Progress Notes (Signed)
Patient ID: Yesenia Gibson, female    DOB: 10-02-1970  MRN: [id number]  CC: Follow-up (Requesting blood work for thyroid, hormone changes/Joint pain, reports swelling all over body, fatigue, hot flashes, numbness in hands, leg cramps, back spasms /No to flu vax. Yes to gastro referral for colonoscopy)   Subjective: Yesenia Gibson is a 52 y.o. female who presents for chronic ds management Her concerns today include:  Panic attacks, seasonal allergies, HL, obesity, perimenopausal   AMN Language interpreter used during this encounter. [id number], Nancy  Pt requesting blood test to check thyroid level. Reports had low functioning  thyroid in past and was on med.  However level normalized and was taken off.  Sister recently dx with thyroid CA.    C/o jt pains in elbows, knees, ankles, wrists x 2 yrs.   Symptoms increase since she began to go through menopause.  Feels like she retains fluid. Swelling in ankles, wrists and feet.   Stiffness in a.m lasting less than 1 hr. gets better once she gets up and moving. Jt pains worse at nights Not taking anything for it.    Was on Effexor for hot flashes.  She stopped taking it because she felt it was causing some swelling but swelling persisted even after stopping it. No menses for 2 years then in 01/2022 she had a cycle that lasted a wk and nothing since then. Still has a lot of hot flashes. Wants hormone levels check.    Obesity: wgh increase 14 lbs since 09/2020 Lot of anxiety about her eating.  Feels an emptiness in stomach like she never gets full.  Tries to eat just 3 meals a day and tries to drink just water.  Patient Active Problem List   Diagnosis Date Noted   Obesity (BMI 30.0-34.9) 08/14/2020   Perimenopausal 08/14/2020   COVID-19 vaccination declined 08/14/2020   Health care maintenance 09/28/2016   Seasonal allergic rhinitis due to pollen 09/07/2016   Anxiety disorder 09/07/2016     Current Outpatient Medications on File Prior  to Visit  Medication Sig Dispense Refill   acetaminophen (TYLENOL) 500 MG tablet Take 500 mg by mouth every 6 (six) hours as needed. For pain     venlafaxine XR (EFFEXOR XR) 37.5 MG 24 hr capsule Take 1 capsule (37.5 mg total) by mouth daily with breakfast. (Patient not taking: Reported on 06/28/2022) 30 capsule 1   No current facility-administered medications on file prior to visit.    No Known Allergies  Social History   Socioeconomic History   Marital status: Single    Spouse name: Not on file   Number of children: 2   Years of education: 9   Highest education level: Not on file  Occupational History   Occupation: house keeping  Tobacco Use   Smoking status: Never   Smokeless tobacco: Never  Vaping Use   Vaping Use: Never used  Substance and Sexual Activity   Alcohol use: No   Drug use: No   Sexual activity: Yes    Birth control/protection: None  Other Topics Concern   Not on file  Social History Narrative   Not on file   Social Determinants of Health   Financial Resource Strain: Not on file  Food Insecurity: Not on file  Transportation Needs: Not on file  Physical Activity: Not on file  Stress: Not on file  Social Connections: Not on file  Intimate Partner Violence: Not on file    Family History  Problem Relation Age  of Onset   Anesthesia problems Neg Hx    Breast cancer Neg Hx     Past Surgical History:  Procedure Laterality Date   AUGMENTATION MAMMAPLASTY Bilateral 2011   saline implants   AUGMENTATION MAMMAPLASTY Bilateral 02/2018   explants    BREAST ENHANCEMENT SURGERY      ROS: Review of Systems Negative except as stated above  PHYSICAL EXAM: BP 119/69 (BP Location: Left Arm, Patient Position: Sitting, Cuff Size: Normal)   Pulse 80   Temp 98.4 F (36.9 C) (Oral)   Ht '5\' 5"'$  (1.651 m)   Wt 196 lb (88.9 kg)   LMP 02/08/2022 (Approximate)   SpO2 97%   BMI 32.62 kg/m   Wt Readings from Last 3 Encounters:  06/28/22 196 lb (88.9 kg)   10/07/20 182 lb 6.4 oz (82.7 kg)  08/14/20 183 lb 8 oz (83.2 kg)    Physical Exam  General appearance - alert, well appearing, and in no distress Mental status - normal mood, behavior, speech, dress, motor activity, and thought processes Neck - supple, no significant adenopathy.  No thyromegaly or nodules on exam. Chest - clear to auscultation, no wheezes, rales or rhonchi, symmetric air entry Heart - normal rate, regular rhythm, normal S1, S2, no murmurs, rubs, clicks or gallops Musculoskeletal -mild edema of the fingers and the wrists.  She has good grip.  Good range of motion of both wrists, elbows, knees and ankles. Extremities - peripheral pulses normal, no pedal edema, no clubbing or cyanosis      Latest Ref Rng & Units 09/01/2020    8:48 AM 08/14/2020   11:29 AM 09/08/2016    3:17 PM  CMP  Glucose 65 - 99 mg/dL  86    BUN 6 - 24 mg/dL  12    Creatinine 0.57 - 1.00 mg/dL  0.69    Sodium 134 - 144 mmol/L  140    Potassium 3.5 - 5.2 mmol/L 4.4  5.3  4.7   Chloride 96 - 106 mmol/L  106    CO2 20 - 29 mmol/L  20    Calcium 8.7 - 10.2 mg/dL  9.5    Total Protein 6.0 - 8.5 g/dL  7.6    Total Bilirubin 0.0 - 1.2 mg/dL  0.5    Alkaline Phos 44 - 121 IU/L  103    AST 0 - 40 IU/L  22    ALT 0 - 32 IU/L  30     Lipid Panel     Component Value Date/Time   CHOL 218 (H) 08/14/2020 1129   TRIG 79 08/14/2020 1129   HDL 65 08/14/2020 1129   CHOLHDL 3.4 08/14/2020 1129   CHOLHDL 3.3 Ratio 09/25/2007 2030   VLDL 20 09/25/2007 2030   LDLCALC 139 (H) 08/14/2020 1129    CBC    Component Value Date/Time   WBC 5.5 08/14/2020 1129   WBC 9.8 07/24/2011 2155   RBC 4.44 08/14/2020 1129   RBC 4.02 07/24/2011 2155   HGB 13.4 08/14/2020 1129   HCT 40.3 08/14/2020 1129   PLT 303 08/14/2020 1129   MCV 91 08/14/2020 1129   MCH 30.2 08/14/2020 1129   MCH 31.1 07/24/2011 2155   MCHC 33.3 08/14/2020 1129   MCHC 34.2 07/24/2011 2155   RDW 12.9 08/14/2020 1129   LYMPHSABS 2.3 09/25/2007  2030   MONOABS 0.3 09/25/2007 2030   EOSABS 0.1 09/25/2007 2030   BASOSABS 0.0 09/25/2007 2030    ASSESSMENT AND PLAN:  1.  Postmenopausal bleeding - Ambulatory referral to Gynecology - US Pelvic Complete With Transvaginal; Future  2. Hot flashes Due to her being menopausal.  Will hold off on putting her on hormone replacement therapy until she is evaluated by gynecology for postmenopausal bleeding. - Ambulatory referral to Gynecology - FSH/LH  3. Obesity (BMI 30.0-34.9) Patient advised to eliminate sugary drinks from the diet, cut back on portion sizes especially of white carbohydrates, eat more white lean meat like chicken [place] and seafood instead of beef or pork and incorporate fresh fruits and vegetables into the diet daily. Encouraged her to try to get in some exercise like walking 3 to 5 days a week for 30 minutes. - CBC - Comprehensive metabolic panel - Lipid panel - Hemoglobin A1c  4. History of hypothyroidism - TSH+T4F+T3Free  5. Encounter for screening mammogram for malignant neoplasm of breast - MM Digital Screening; Future  6. Screening for colon cancer - Ambulatory referral to Gastroenterology  7. Polyarthralgia Recommend over-the-counter Aleve or Advil. - Sedimentation Rate - Rheumatoid factor - CYCLIC CITRUL PEPTIDE ANTIBODY, IGG/IGA    Patient was given the opportunity to ask questions.  Patient verbalized understanding of the plan and was able to repeat key elements of the plan.   This documentation was completed using Radio producer.  Any transcriptional errors are unintentional.  No orders of the defined types were placed in this encounter.    Requested Prescriptions    No prescriptions requested or ordered in this encounter    No follow-ups on file.  Karle Plumber, MD, FACP

## 2022-06-29 ENCOUNTER — Encounter: Payer: Self-pay | Admitting: Gastroenterology

## 2022-06-29 LAB — LIPID PANEL: Chol/HDL Ratio: 4.1 ratio (ref 0.0–4.4)

## 2022-06-30 LAB — CYCLIC CITRUL PEPTIDE ANTIBODY, IGG/IGA: Cyclic Citrullin Peptide Ab: 6 units (ref 0–19)

## 2022-06-30 LAB — COMPREHENSIVE METABOLIC PANEL
ALT: 18 IU/L (ref 0–32)
AST: 21 IU/L (ref 0–40)
Albumin/Globulin Ratio: 1.4 (ref 1.2–2.2)
Albumin: 4.4 g/dL (ref 3.9–4.9)
Alkaline Phosphatase: 101 IU/L (ref 44–121)
BUN/Creatinine Ratio: 24 — ABNORMAL HIGH (ref 9–23)
BUN: 17 mg/dL (ref 6–24)
Bilirubin Total: 0.3 mg/dL (ref 0.0–1.2)
CO2: 20 mmol/L (ref 20–29)
Calcium: 10.2 mg/dL (ref 8.7–10.2)
Chloride: 105 mmol/L (ref 96–106)
Creatinine, Ser: 0.7 mg/dL (ref 0.57–1.00)
Globulin, Total: 3.1 g/dL (ref 1.5–4.5)
Glucose: 87 mg/dL (ref 70–99)
Potassium: 5.2 mmol/L (ref 3.5–5.2)
Sodium: 139 mmol/L (ref 134–144)
Total Protein: 7.5 g/dL (ref 6.0–8.5)
eGFR: 106 mL/min/{1.73_m2} (ref >59)

## 2022-06-30 LAB — LIPID PANEL
Cholesterol, Total: 248 mg/dL — ABNORMAL HIGH (ref 100–199)
HDL: 60 mg/dL (ref >39)
LDL Chol Calc (NIH): 163 mg/dL — ABNORMAL HIGH (ref 0–99)
Triglycerides: 137 mg/dL (ref 0–149)
VLDL Cholesterol Cal: 25 mg/dL (ref 5–40)

## 2022-06-30 LAB — CBC
Hematocrit: 40 % (ref 34.0–46.6)
Hemoglobin: 13.5 g/dL (ref 11.1–15.9)
MCH: 30.3 pg (ref 26.6–33.0)
MCHC: 33.8 g/dL (ref 31.5–35.7)
MCV: 90 fL (ref 79–97)
Platelets: 330 10*3/uL (ref 150–450)
RBC: 4.46 x10E6/uL (ref 3.77–5.28)
RDW: 13.1 % (ref 11.7–15.4)
WBC: 6 10*3/uL (ref 3.4–10.8)

## 2022-06-30 LAB — HEMOGLOBIN A1C
Est. average glucose Bld gHb Est-mCnc: 114 mg/dL
Hgb A1c MFr Bld: 5.6 % (ref 4.8–5.6)

## 2022-06-30 LAB — SEDIMENTATION RATE: Sed Rate: 42 mm/hr — ABNORMAL HIGH (ref 0–32)

## 2022-06-30 LAB — TSH+T4F+T3FREE
Free T4: 0.89 ng/dL (ref 0.82–1.77)
T3, Free: 2.8 pg/mL (ref 2.0–4.4)
TSH: 1.75 u[IU]/mL (ref 0.450–4.500)

## 2022-06-30 LAB — FSH/LH
FSH: 97.6 m[IU]/mL
LH: 64.2 m[IU]/mL

## 2022-06-30 LAB — RHEUMATOID FACTOR: Rheumatoid fact SerPl-aCnc: 10.5 IU/mL (ref <14.0)

## 2022-07-20 ENCOUNTER — Ambulatory Visit (AMBULATORY_SURGERY_CENTER): Payer: Commercial Managed Care - PPO | Admitting: *Deleted

## 2022-07-20 VITALS — Ht 67.0 in | Wt 192.2 lb

## 2022-07-20 DIAGNOSIS — Z1211 Encounter for screening for malignant neoplasm of colon: Secondary | ICD-10-CM

## 2022-07-20 MED ORDER — NA SULFATE-K SULFATE-MG SULF 17.5-3.13-1.6 GM/177ML PO SOLN: 1.0000 | Freq: Once | ORAL | 0 refills | Status: AC

## 2022-07-20 NOTE — Progress Notes (Signed)
No egg or soy allergy known to patient  No issues known to pt with past sedation with any surgeries or procedures Patient denies ever being told they had issues or difficulty with intubation  No FH of Malignant Hyperthermia Pt is not on diet pills Pt is not on  home 02  Pt is not on blood thinners  Pt denies issues with constipation  No A fib or A flutter Have any cardiac testing pending--no Pt instructed to use Singlecare.com or GoodRx for a price reduction on prep    Spanish interpreter ALIS present for pre-visit.  Patient's chart reviewed by Osvaldo Angst CNRA prior to previsit and patient appropriate for the [facility].  Previsit completed and red dot placed by patient's name on their procedure day (on provider's schedule).

## 2022-07-26 ENCOUNTER — Ambulatory Visit
Admission: RE | Admit: 2022-07-26 | Discharge: 2022-07-26 | Disposition: A | Discharge status: Home / Self Care | Payer: Commercial Managed Care - PPO | Source: Ambulatory Visit | Attending: Internal Medicine | Admitting: Internal Medicine

## 2022-07-26 DIAGNOSIS — N950 Postmenopausal bleeding: Secondary | ICD-10-CM

## 2022-07-26 NOTE — Progress Notes (Signed)
Let patient know that the pelvic ultrasound reveals that she has fibroids in the uterus.  This is not a new finding.  We have referred her to gynecology for further evaluation of her postmenopausal bleeding that occurred several months ago.

## 2022-07-28 ENCOUNTER — Telehealth: Payer: Self-pay | Admitting: Internal Medicine

## 2022-07-28 NOTE — Telephone Encounter (Signed)
-----   Message from Ena Dawley sent at 07/28/2022  2:14 PM EDT ----- Regarding: RE: GYN Referral  Patient  schedule   08/09/2022  @ 8:15am   ----- Message ----- From: Ladell Pier, MD Sent: 07/26/2022   8:45 PM EDT To: Ena Dawley Subject: GYN Referral                                   Please make sure that she gets an appointment with gynecology for postmenopausal bleeding.

## 2022-08-03 ENCOUNTER — Ambulatory Visit: Payer: Self-pay | Admitting: *Deleted

## 2022-08-03 NOTE — Telephone Encounter (Signed)
  Chief Complaint: Returned call and was given her Pelvic U/S message from Dr. Laural Benes Symptoms:  Frequency:  Pertinent Negatives: Patient denies  Disposition: [] ED /[] Urgent Care (no appt availability in office) / [] Appointment(In office/virtual)/ []  [facility]/ [] Home Care/ [] Refused Recommended Disposition /[]  Mobile Bus/ []  Follow-up with PCP Additional Notes: No questions from pt.    Has a GYN appt on Monday

## 2022-08-03 NOTE — Telephone Encounter (Signed)
Pt called in with Spanish interpreter Byrd Hesselbach [phone].  Called in and was given the ultrasound result from Dr. Jonah Blue dated 07/26/2022 at 8:45 PM.  No questions from pt.   She has a GYN appt. For Monday.    Reason for Disposition  [1] Follow-up call to recent contact AND [2] information only call, no triage required  Answer Assessment - Initial Assessment Questions 1. REASON FOR CALL or QUESTION: "What is your reason for calling today?" or "How can I best help you?" or "What question do you have that I can help answer?"     Pt called in and was given the ultrasound result message from Dr. Jonah Blue.  Protocols used: Information Only Call - No Triage-A-AH

## 2022-08-09 ENCOUNTER — Ambulatory Visit (INDEPENDENT_AMBULATORY_CARE_PROVIDER_SITE_OTHER): Payer: Commercial Managed Care - PPO | Admitting: Obstetrics and Gynecology

## 2022-08-09 ENCOUNTER — Encounter: Payer: Self-pay | Admitting: Obstetrics and Gynecology

## 2022-08-09 VITALS — BP 116/81 | HR 76 | Ht 67.0 in | Wt 192.0 lb

## 2022-08-09 DIAGNOSIS — D219 Benign neoplasm of connective and other soft tissue, unspecified: Secondary | ICD-10-CM | POA: Diagnosis not present

## 2022-08-09 DIAGNOSIS — Z8742 Personal history of other diseases of the female genital tract: Secondary | ICD-10-CM | POA: Diagnosis not present

## 2022-08-09 DIAGNOSIS — Z1339 Encounter for screening examination for other mental health and behavioral disorders: Secondary | ICD-10-CM

## 2022-08-09 DIAGNOSIS — Z780 Asymptomatic menopausal state: Secondary | ICD-10-CM | POA: Diagnosis not present

## 2022-08-09 NOTE — Progress Notes (Signed)
CC: postmenopausal bleeding and uterine fibroids Subjective:    Patient ID: Yesenia Gibson, female    DOB: 01-31-1971, 52 y.o.   MRN: [id number]  HPI 52 yo G5P1, SVD x 1 seen for discussion of postmenopausal bleeding and uterine fibroids.  Pelvic ultrasound reviewed.  Pt has small uterus with 3 fibroids.  One fibroid is already calcified and likely inactive. Pt had amenorrhea x 2 years prior to 1 week of vaginal bleeding 10/23.  She has had no recurrence of bleeding since then.  She did note a history of hot flashes initially, but they have improved over the last few years.    Review of Systems     Objective:   Physical Exam Constitutional:      Appearance: Normal appearance. She is normal weight.  HENT:     Head: Normocephalic and atraumatic.  Cardiovascular:     Rate and Rhythm: Normal rate and regular rhythm.     Heart sounds: Normal heart sounds.  Pulmonary:     Effort: Pulmonary effort is normal.     Breath sounds: Normal breath sounds.  Genitourinary:    Comments: Ext genitalia: WNL Vagina: normal but slightly atrophic in appearance Cervix: appears slightly stenotic and atrophic Uterus: subserosal fibroid palpable, but the uterus itself is normal in size Neurological:     Mental Status: She is alert.    Vitals:   08/09/22 0824  BP: 116/81  Pulse: 76  CLINICAL DATA:  N 95.0 postmenopausal bleeding, no menses for 1.5 years then had bleeding in October 2023   EXAM: TRANSABDOMINAL AND TRANSVAGINAL ULTRASOUND OF PELVIS   TECHNIQUE: Both transabdominal and transvaginal ultrasound examinations of the pelvis were performed. Transabdominal technique was performed for global imaging of the pelvis including uterus, ovaries, adnexal regions, and pelvic cul-de-sac. It was necessary to proceed with endovaginal exam following the transabdominal exam to visualize the endometrium.   COMPARISON:  05/31/2008   FINDINGS: Uterus   Measurements: 6.2 x 4.0 x 5.7 cm = volume: 73  mL. Anteverted. Heterogeneous myometrium. Exophytic partially calcified leiomyoma anterior upper uterus 4.4 cm greatest size. Submucosal leiomyoma mid upper uterus 2.1 cm. Smaller intramural leiomyoma anterior mid uterus 1.6 cm.   Endometrium   Obscured by leiomyomata   Right ovary   Measurements: 2.1 x 1.9 x 1.8 cm = volume: 3.7 mL. Normal morphology without mass   Left ovary   Measurements: 2.7 x 1.5 x 1.4 cm = volume: 2.9 mL. Normal morphology without mass   Other findings   No free pelvic fluid or adnexal masses.   IMPRESSION: Multiple uterine leiomyomata, largest 4.4 cm diameter, including a 2.1 cm submucosal leiomyoma at mid upper uterus.   Unremarkable ovaries and adnexa.   Endometrial complex not visualized; in the setting of post-menopausal bleeding, endometrial sampling is indicated to exclude carcinoma. If results are benign, sonohysterogram should be considered for focal lesion work-up. (Ref: Radiological Reasoning: Algorithmic Workup of Abnormal Vaginal Bleeding with Endovaginal Sonography and Sonohysterography. AJR 2008; 409:W11-91)         Assessment & Plan:   1. Fibroids Since pt is postmenopausal , can pursue expectant management.  If they become problematic consider UFE, Sonata or hyst  2. History of postmenopausal bleeding Discussed ramification of PMB.  Pt has not had any further bleeding for 6 months.  It is possible the bleeding was from atrophy or fibroid. Pt given choice.  Expectant management at this time with endometrial bleeding if there is any new bleeding or endometrial biopsy today. Pt  desires expectant management  3. Menopause present Labs reviewed and FSH is c/w menopause. Discussed menopause with patient.  Can consider vaginal estrogen if needed.  Pt notes vasomotor symptoms are currently mild.  Advise bone density scan in 2-3 years.   F/u in 6 months.    Warden Fillers, MD Faculty Attending, [facility]

## 2022-08-09 NOTE — Progress Notes (Signed)
Establish care/ GYN Reports postmenopausal bleeding and hot flashes 10/14/20-01/2022 no menses, October one menses, No bleeding now Last Pap 10/07/20 w/ PCP Dr. Timoteo Expose Had [place] 1 week ago and pt reports fibroids and then referral

## 2022-08-10 ENCOUNTER — Ambulatory Visit
Admission: RE | Admit: 2022-08-10 | Discharge: 2022-08-10 | Disposition: A | Discharge status: Home / Self Care | Payer: Commercial Managed Care - PPO | Source: Ambulatory Visit | Attending: Internal Medicine | Admitting: Internal Medicine

## 2022-08-10 DIAGNOSIS — Z1231 Encounter for screening mammogram for malignant neoplasm of breast: Secondary | ICD-10-CM

## 2022-08-22 ENCOUNTER — Encounter: Payer: Self-pay | Admitting: Certified Registered Nurse Anesthetist

## 2022-08-23 ENCOUNTER — Encounter: Payer: Self-pay | Admitting: Gastroenterology

## 2022-08-23 ENCOUNTER — Ambulatory Visit (AMBULATORY_SURGERY_CENTER): Payer: Commercial Managed Care - PPO | Admitting: Gastroenterology

## 2022-08-23 VITALS — BP 108/67 | HR 64 | Temp 97.8°F | Resp 20 | Ht 65.0 in | Wt 192.2 lb

## 2022-08-23 DIAGNOSIS — D123 Benign neoplasm of transverse colon: Secondary | ICD-10-CM | POA: Diagnosis not present

## 2022-08-23 DIAGNOSIS — Z1211 Encounter for screening for malignant neoplasm of colon: Secondary | ICD-10-CM | POA: Diagnosis present

## 2022-08-23 DIAGNOSIS — K635 Polyp of colon: Secondary | ICD-10-CM | POA: Diagnosis not present

## 2022-08-23 MED ORDER — SODIUM CHLORIDE 0.9 % IV SOLN: 500.0000 mL | Freq: Once | INTRAVENOUS | Status: DC

## 2022-08-23 NOTE — Progress Notes (Signed)
Report given to PACU, vss 

## 2022-08-23 NOTE — Patient Instructions (Signed)
Please read handouts provided. Continue present medications. Await pathology results. Resume previous diet.   USTED TUVO UN PROCEDIMIENTO ENDOSCPICO HOY EN [facility]:   Lea el informe del procedimiento que se le entreg para cualquier pregunta especfica sobre lo que se Dentist.  Si el informe del examen no responde a sus preguntas, por favor llame a su gastroenterlogo para aclararlo.  Si usted solicit que no se le den [employer] de lo que se Clinical cytogeneticist en su procedimiento al [employer] va a cuidar, entonces el informe del procedimiento se ha incluido en un sobre sellado para que usted lo revise despus cuando le sea ms conveniente.   LO QUE PUEDE ESPERAR: Algunas sensaciones de hinchazn en el abdomen.  Puede tener ms gases de lo normal.  El caminar puede ayudarle a eliminar el aire que se le puso en el tracto gastrointestinal durante el procedimiento y reducir la hinchazn.  Si le hicieron una endoscopia inferior (como una colonoscopia o una sigmoidoscopia flexible), podra notar manchas de sangre en las heces fecales o en el papel higinico.  Si se someti a una preparacin intestinal para su procedimiento, es posible que no tenga una evacuacin intestinal normal durante [employer].   Tenga en cuenta:  Es posible que note un poco de irritacin y congestin en la nariz o algn drenaje.  Esto es debido al oxgeno [employer] durante su procedimiento.  No hay que preocuparse y esto debe desaparecer ms o Regulatory affairs officer.   SNTOMAS PARA REPORTAR INMEDIATAMENTE:  Despus de una endoscopia inferior (colonoscopia o sigmoidoscopia flexible):  Cantidades excesivas de sangre en las heces fecales  Sensibilidad significativa o empeoramiento de los dolores abdominales   Hinchazn aguda del abdomen que antes no tena   Fiebre de 100F o ms      Para asuntos urgentes o de Associate Professor, puede comunicarse con un gastroenterlogo a cualquier hora llamando al [phone].  DIETA:  Recomendamos una comida pequea al principio, pero luego puede continuar con su dieta normal.  Tome muchos lquidos, Tax adviser las bebidas alcohlicas durante 24 horas.    ACTIVIDAD:  Debe planear tomarse las cosas con calma por el resto del da y no debe CONDUCIR ni usar maquinaria pesada Patent examiner (debido a los medicamentos de sedacin utilizados durante el examen).     SEGUIMIENTO: Nuestro personal llamar al nmero que aparece en su historial al siguiente da hbil de su procedimiento para ver cmo se siente y para responder cualquier pregunta o inquietud que pueda tener con respecto a la informacin que se le dio despus del procedimiento. Si no podemos contactarle, le dejaremos un mensaje.  Sin embargo, si se siente bien y no tiene English as a second language teacher, no es necesario que nos devuelva la llamada.  Asumiremos que ha regresado a sus actividades diarias normales sin incidentes. Si se le tomaron algunas biopsias, le contactaremos por telfono o por carta en las prximas 3 semanas.  Si no ha sabido [employer] biopsias en el transcurso de 3 semanas, por favor llmenos al [phone].   FIRMAS/CONFIDENCIALIDAD: Usted y/o el acompaante que le cuide han firmado documentos que se ingresarn en su historial mdico electrnico.  Estas firmas atestiguan el hecho de que la informacin anterior

## 2022-08-23 NOTE — Progress Notes (Signed)
Pt's states no medical or surgical changes since previsit or office visit. 

## 2022-08-23 NOTE — Op Note (Signed)
[facility] Patient Name: Yesenia Gibson Procedure Date: 08/23/2022 8:35 AM MRN: [id number] Endoscopist: Sherilyn Cooter L. Myrtie Neither , MD, [id number] Age: 52 Referring MD:  Date of Birth: 1970-07-18 Gender: Female Account #: [id number] Procedure:                Colonoscopy Indications:              Screening for colorectal malignant neoplasm, This                            is the patient's first colonoscopy Medicines:                Monitored Anesthesia Care Procedure:                Pre-Anesthesia Assessment:                           - Prior to the procedure, a History and Physical                            was performed, and patient medications and                            allergies were reviewed. The patient's tolerance of                            previous anesthesia was also reviewed. The risks                            and benefits of the procedure and the sedation                            options and risks were discussed with the patient.                            All questions were answered, and informed consent                            was obtained. Prior Anticoagulants: The patient has                            taken no anticoagulant or antiplatelet agents. ASA                            Grade Assessment: II - A patient with mild systemic                            disease. After reviewing the risks and benefits,                            the patient was deemed in satisfactory condition to                            undergo the procedure.  After obtaining informed consent, the colonoscope                            was passed under direct vision. Throughout the                            procedure, the patient's blood pressure, pulse, and                            oxygen saturations were monitored continuously. The                            Olympus CF-HQ190L ([id number]) Colonoscope was                            introduced through the  anus and advanced to the the                            cecum, identified by appendiceal orifice and                            ileocecal valve. The colonoscopy was performed                            without difficulty. The patient tolerated the                            procedure well. The quality of the bowel                            preparation was excellent. The ileocecal valve,                            appendiceal orifice, and rectum were photographed.                            The bowel preparation used was SUPREP via split                            dose instruction. Scope In: 8:37:11 AM Scope Out: 8:51:19 AM Scope Withdrawal Time: 0 hours 8 minutes 40 seconds  Total Procedure Duration: 0 hours 14 minutes 8 seconds  Findings:                 The perianal and digital rectal examinations were                            normal.                           Repeat examination of right colon under NBI                            performed.  A 6 mm polyp was found in the hepatic flexure. The                            polyp was mucous-capped and semi-sessile. The polyp                            was removed with a cold snare. Resection and                            retrieval were complete.                           A few diverticula were found in the left colon.                           Internal hemorrhoids were found. (midline posterior                            column)                           The exam was otherwise without abnormality on                            direct and retroflexion views. Complications:            No immediate complications. Estimated Blood Loss:     Estimated blood loss was minimal. Impression:               - One 6 mm polyp at the hepatic flexure, removed                            with a cold snare. Resected and retrieved.                           - Diverticulosis in the left colon.                           - Internal  hemorrhoids.                           - The examination was otherwise normal on direct                            and retroflexion views. Recommendation:           - Patient has a contact number available for                            emergencies. The signs and symptoms of potential                            delayed complications were discussed with the                            patient. Return to normal activities tomorrow.  Written discharge instructions were provided to the                            patient.                           - Resume previous diet.                           - Continue present medications.                           - Await pathology results.                           - Repeat colonoscopy is recommended for                            surveillance. The colonoscopy date will be                            determined after pathology results from today's                            exam become available for review. Gisella Alwine L. Myrtie Neither, MD 08/23/2022 8:57:07 AM This report has been signed electronically.

## 2022-08-23 NOTE — Progress Notes (Signed)
History and Physical:  This patient presents for endoscopic testing for: Encounter Diagnosis  Name Primary?   Special screening for malignant neoplasms, colon Yes    Spanish interpreter present for pre-procedure evaluation.  Average risk for colorectal cancer.  First screening exam. Patient denies chronic abdominal pain, rectal bleeding, constipation or diarrhea.   Patient is otherwise without complaints or active issues today.   Past Medical History: Past Medical History:  Diagnosis Date   Hyperlipidemia    No pertinent past medical history      Past Surgical History: Past Surgical History:  Procedure Laterality Date   AUGMENTATION MAMMAPLASTY Bilateral 2011   saline implants   AUGMENTATION MAMMAPLASTY Bilateral 02/2018   explants     Allergies: No Known Allergies  Outpatient Meds: Current Outpatient Medications  Medication Sig Dispense Refill   Cholecalciferol (VITAMIN D-3 PO) Take by mouth daily. ONE CAPSULE DAILY     MAGNESIUM PO Take by mouth daily. TAKE ONE DAILY     Multiple Vitamins-Minerals (ZINC PO) Take by mouth daily. TAKE ONE DAILY     Omega-3 Fatty Acids (OMEGA-3 FISH OIL PO) Take by mouth daily. TAKE ONE DAILY     acetaminophen (TYLENOL) 500 MG tablet Take 500 mg by mouth every 6 (six) hours as needed. For pain     ibuprofen (ADVIL) 400 MG tablet Take 400 mg by mouth as needed.     Current Facility-Administered Medications  Medication Dose Route Frequency Provider Last Rate Last Admin   0.9 %  sodium chloride infusion  500 mL Intravenous Once Charlie Pitter III, MD          ___________________________________________________________________ Objective   Exam:  BP 101/68   Pulse 71   Temp 97.8 F (36.6 C)   Ht 5\' 5"  (1.651 m)   Wt 192 lb 3.2 oz (87.2 kg)   LMP 02/08/2022 (Approximate)   SpO2 99%   BMI 31.98 kg/m   CV: regular , S1/S2 Resp: clear to auscultation bilaterally, normal RR and effort noted GI: soft, no tenderness, with  active bowel sounds.   Assessment: Encounter Diagnosis  Name Primary?   Special screening for malignant neoplasms, colon Yes     Plan: Colonoscopy  The benefits and risks of the planned procedure were described in detail with the patient or (when appropriate) their health care proxy.  Risks were outlined as including, but not limited to, bleeding, infection, perforation, adverse medication reaction leading to cardiac or pulmonary decompensation, pancreatitis (if ERCP).  The limitation of incomplete mucosal visualization was also discussed.  No guarantees or warranties were given.    The patient is appropriate for an endoscopic procedure in the ambulatory setting.   - Amada Jupiter, MD

## 2022-08-23 NOTE — Progress Notes (Signed)
Called to room to assist during endoscopic procedure.  Patient ID and intended procedure confirmed with present staff. Received instructions for my participation in the procedure from the performing physician.  

## 2022-08-24 ENCOUNTER — Telehealth: Payer: Self-pay

## 2022-08-24 NOTE — Telephone Encounter (Signed)
Attempted follow up post colonoscopy; no answer; left VM 

## 2022-08-26 ENCOUNTER — Encounter: Payer: Self-pay | Admitting: Gastroenterology

## 2022-11-01 ENCOUNTER — Encounter: Payer: Self-pay | Admitting: Internal Medicine

## 2022-11-01 ENCOUNTER — Ambulatory Visit: Payer: Commercial Managed Care - PPO | Attending: Internal Medicine | Admitting: Internal Medicine

## 2022-11-01 VITALS — BP 130/82 | HR 66 | Temp 98.2°F | Ht 64.0 in | Wt 189.0 lb

## 2022-11-01 DIAGNOSIS — E669 Obesity, unspecified: Secondary | ICD-10-CM

## 2022-11-01 DIAGNOSIS — N950 Postmenopausal bleeding: Secondary | ICD-10-CM | POA: Diagnosis not present

## 2022-11-01 DIAGNOSIS — Z1231 Encounter for screening mammogram for malignant neoplasm of breast: Secondary | ICD-10-CM

## 2022-11-01 DIAGNOSIS — M7661 Achilles tendinitis, right leg: Secondary | ICD-10-CM

## 2022-11-01 DIAGNOSIS — M7662 Achilles tendinitis, left leg: Secondary | ICD-10-CM

## 2022-11-01 DIAGNOSIS — M62838 Other muscle spasm: Secondary | ICD-10-CM | POA: Diagnosis not present

## 2022-11-01 MED ORDER — METHOCARBAMOL 500 MG PO TABS
500.0000 mg | ORAL_TABLET | Freq: Two times a day (BID) | ORAL | 2 refills | Status: AC | PRN
Start: 2022-11-01 — End: ?

## 2022-11-01 MED ORDER — MELOXICAM 15 MG PO TABS
15.0000 mg | ORAL_TABLET | Freq: Every day | ORAL | 0 refills | Status: DC
Start: 2022-11-01 — End: 2022-11-22

## 2022-11-01 NOTE — Patient Instructions (Signed)
Alimentacin saludable en los adultos Healthy Eating, Adult Una alimentacin saludable puede ayudarlo a alcanzar y mantener un peso saludable, reducir el riesgo de tener enfermedades crnicas y vivir una vida larga y productiva. Es importante que siga una modalidad de alimentacin saludable. Sus necesidades nutricionales y calricas deben satisfacerse principalmente con distintos alimentos ricos en nutrientes. Consejos para seguir este plan Lea las etiquetas de los alimentos Lea las etiquetas y elija las que digan lo siguiente: Productos reducidos en sodio o con bajo contenido de sodio. Jugos con 100 % jugo de fruta. Alimentos con bajo contenido de grasas saturadas (menos de 3 g por porcin) y alto contenido de grasas poliinsaturadas y monoinsaturadas. Alimentos con cereales integrales, como trigo integral, trigo partido, arroz integral y arroz salvaje. Cereales integrales fortificados con cido flico. Esto se recomienda a las mujeres embarazadas o que desean quedar embarazadas. Lea las etiquetas y no coma ni beba lo siguiente: Alimentos o bebidas con azcar agregada. Estos incluyen los alimentos que contienen azcar moreno, endulzante a base de maz, jarabe de maz, dextrosa, fructosa, glucosa, jarabe de maz de alta fructosa, miel, azcar invertido, lactosa, jarabe de malta, maltosa, melaza, azcar sin refinar, sacarosa, trehalosa y azcar turbinado. Limite el consumo de azcar agregada a menos del 10 % del total de caloras diarias. No consuma ms que las siguientes cantidades de azcar agregada por da: 6 cucharaditas (25 g) para las mujeres. 9 cucharaditas (38 g) para los hombres. Los alimentos que contienen almidones y cereales refinados o procesados. Los productos de cereales refinados, como harina blanca, harina de maz desgerminada, pan blanco y arroz blanco. Al ir de compras Elija refrigerios ricos en nutrientes, como verduras, frutas enteras y frutos secos. Evite los refrigerios con  alto contenido de caloras y azcar, como las papas fritas, los refrigerios frutales y los caramelos. Use alios y productos para untar a base de aceite con los alimentos en lugar de grasas slidas como la mantequilla, la margarina, la crema agria o el queso crema. Limite las salsas, las mezclas y los productos "instantneos" preelaborados como el arroz saborizado, los fideos instantneos y las pastas listas para comer. Pruebe ms fuentes de protena vegetal, como tofu, tempeh, frijoles negros, edamame, lentejas, frutos secos y semillas. Explore planes de alimentacin como la dieta mediterrnea o la dieta vegetariana. Pruebe salsas cardiosaludables hechas con frijoles y grasas saludables, como hummus y guacamole. Las verduras van muy bien con ellas. Al cocinar Use aceite para saltear los alimentos en lugar de grasas slidas como mantequilla, margarina o manteca de cerdo. En lugar de frer, trate de cocinar en el horno, en la plancha o en la parrilla, o hervir los alimentos. Retire la parte grasa de las carnes antes de cocinarlas. Cocine las verduras al vapor en agua o caldo. Planificacin de las comidas  En las comidas, imagine dividir su plato en cuartos: La mitad del plato tiene frutas y verduras. Un cuarto del plato tiene cereales integrales. Un cuarto del plato tiene protena, especialmente carnes magras, aves, huevos, tofu, frijoles o frutos secos. Incluya lcteos descremados en su dieta diaria. Estilo de vida Elija opciones saludables en todos los mbitos, como en el hogar, el trabajo, la escuela, los restaurantes y las tiendas. Prepare los alimentos de un modo seguro: Lvese las manos despus de manipular carnes crudas. Donde prepare alimentos, mantenga las superficies limpias lavndolas regularmente con agua caliente y jabn. Mantenga las carnes crudas separadas de los alimentos que estn listos para comer como las frutas y las verduras. Cocine los frutos   de mar, carnes, aves y huevos  hasta alcanzar la temperatura recomendada. Consiga un termmetro para alimentos. Almacene los alimentos a temperaturas seguras. En general: Mantenga los alimentos fros a una temperatura de 40 F (4,4 C) o inferior. Mantenga los alimentos calientes a una temperatura de 140 F (60 C) o superior. Mantenga el congelador a una temperatura de 0 F (-17,8 C) o inferior. Los alimentos no son seguros para su consumo cuando han estado a una temperatura de entre 40 y 140 F (4.4 y 60 C) por ms de 2 horas. Qu alimentos debo comer? Frutas Propngase comer entre 1 y 2 tazas de frutas frescas, enlatadas (en su jugo natural) o congeladas cada da. Una taza de fruta equivale a 1 manzana pequea, 1 banana grande, 8 fresas grandes, 1 taza (237 g) de fruta enlatada,  taza (82 g) de fruta seca o 1 taza (240 ml) de jugo al 100 %. Verduras Propngase comer de 2 a 4 tazas de verduras frescas y congeladas cada da, incluyendo diferentes variedades y colores. Una taza de verduras equivale a 1 taza (91 g) de brcoli o coliflor, 2 zanahorias medianas, 2 tazas (150 g) de verduras de hoja verde crudas, 1 tomate grande, 1 pimiento morrn grande, 1 batata grande o 1 patata blanca mediana. Cereales Propngase comer el equivalente a entre 4 y 10 onzas de cereales integrales por da. Algunos ejemplos de equivalentes a 1 onza de cereales son 1 rebanada de pan, 1 taza (40 g) de cereal listo para comer, 3 tazas (24 g) de palomitas de maz o  taza (93 g) de arroz cocido. Carnes y otras protenas Propngase comer el equivalente a entre 5 y 7  onzas de protena por da. Algunos ejemplos de equivalentes a 1 onza de protenas incluyen 1 huevo,  oz de frutos secos (12 almendras, 24 pistachos o 7 mitades de nueces), 1/4 taza (90 g) de frijoles cocidos, 6 cucharadas (90 g) de hummus o 1 cucharada (16 g) de mantequilla de man. Un corte de carne o pescado del tamao de un mazo de cartas equivale aproximadamente a 3 a 4 onzas (85  g). De las protenas que consume cada semana, intente que al menos 8 onzas (227 g) sean frutos de mar. Esto equivale a unas 2 porciones por semana. Esto incluye salmn, trucha, arenque y anchoas. Lcteos Propngase comer el equivalente a 3 tazas de lcteos descremados o con bajo contenido de grasa cada da. Algunos ejemplos de equivalentes a 1 taza de lcteos son 1 taza (240 ml) de leche, 8 onzas (250 g) de yogur, 1 onzas (44 g) de queso natural o 1 taza (240 ml) de leche de soja fortificada. Grasas y aceites Propngase consumir alrededor de 5 cucharaditas (21 g) de grasas y aceites por da. Elija grasas monoinsaturadas, como el aceite de canola y de oliva, la mayonesa hecha con aceite de oliva o de aguacate, aguacates, mantequilla de man y la mayora de los frutos secos, o bien grasas poliinsaturadas, como el aceite de girasol, maz y soja, nueces, piones, semillas de ssamo, semillas de girasol y semillas de lino. Bebidas Propngase beber 6 vasos de 8 onzas de agua por da. Limite el caf a entre 3 y 5 tazas de ocho onzas por da. Limite el consumo de bebidas con cafena que tengan caloras agregadas, como los refrescos y las bebidas energizantes. Si bebe alcohol: Limite la cantidad que bebe a lo siguiente: De 0 a 1 medida al da si es mujer. De 0 a 2 medidas   al da si es varn. Sepa cunta cantidad de alcohol hay en las bebidas que toma. En los [place], una medida es una botella de cerveza de 12 oz (355 ml), un vaso de vino de 5 oz (148 ml) o un vaso de una bebida alcohlica de alta graduacin de 1 oz (44 ml). Condimentos y otros alimentos Trate de no agregar demasiada sal a los alimentos. Trate de usar hierbas y especias en lugar de sal. Trate de no agregar azcar a los alimentos. Esta informacin se basa en las pautas de nutricin de los [place] Para obtener ms informacin, visite choosemyplate.gov. Las cantidades exactas pueden variar. Es posible que necesite cantidades  diferentes. Esta informacin no tiene como fin reemplazar el consejo del mdico. Asegrese de hacerle al mdico cualquier pregunta que tenga. Document Revised: 02/09/2022 Document Reviewed: 02/09/2022 Elsevier Patient Education  2024 Elsevier Inc.  

## 2022-11-01 NOTE — Progress Notes (Signed)
Patient ID: Yesenia Gibson, female    DOB: 1970-09-10  MRN: [id number]  CC: Follow-up (F/u. Herold Harms referral for mammogram - pt brought previous mammogram results 2020./Experiencing spasms on arms & shoulders X44mo /Reports unable to loose weight with diet changes. )   Subjective: Yesenia Gibson is a 52 y.o. female who presents for follow up on PMB, obesity, and muscle pain  Interpreter was used for this visit # Alma Friend[place] [id number]  Her concerns today include: muscle spasms  PMB No further PMB. Has seen Gyn and decided to observe for now rather than endometrial biopsy.  2. Muscle spasms One month ago muscle spasms began in her upper chest, b/l shoulders, and upper back pain and is constant. Pain occurs when she applies pressure to areas, and she states feeling "air" release when she applies this pressure. Does not feel pain when sleeping/laying down. Stretching and moving or sitting for long periods of time elicits pain. 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 Similar spasms in feet and reports continuing  to have foot swelling and burning pain. Hands get numb often, but does not feel like her tendoitis before, feels it could be unrelated to spasms. Denies recent travel.  Applies warm and cold patches to painful areas which provides mild relief. Massaging temporarily relieves. Tylenol and ibuprofen do not help.   4. Obesity  She began Diet changes at last visit- drinking more water, cut out breads, rice, candy and most red meats. She has lost 7 pounds since last visit 4 months ago. Has constant pain in posterior aspect of b/l ankles, mainly the Ach tendons which limits exercise. Wears comfortable shoes at work. Works in Stage manager. Tylenol and ibuprofen do not help. BP does not usually run high like today.  5. HM Due for mammogram. Will get shingles vaccine.      Patient Active Problem List   Diagnosis Date Noted   Fibroids 08/09/2022   History of postmenopausal  bleeding 08/09/2022   Menopause present 08/09/2022   Obesity (BMI 30.0-34.9) 08/14/2020   Perimenopausal 08/14/2020   COVID-19 vaccination declined 08/14/2020   Health care maintenance 09/28/2016   Seasonal allergic rhinitis due to pollen 09/07/2016   Anxiety disorder 09/07/2016     Current Outpatient Medications on File Prior to Visit  Medication Sig Dispense Refill   acetaminophen (TYLENOL) 500 MG tablet Take 500 mg by mouth every 6 (six) hours as needed. For pain     Cholecalciferol (VITAMIN D-3 PO) Take by mouth daily. ONE CAPSULE DAILY     ibuprofen (ADVIL) 400 MG tablet Take 400 mg by mouth as needed.     MAGNESIUM PO Take by mouth daily. TAKE ONE DAILY     Multiple Vitamins-Minerals (ZINC PO) Take by mouth daily. TAKE ONE DAILY     Omega-3 Fatty Acids (OMEGA-3 FISH OIL PO) Take by mouth daily. TAKE ONE DAILY     No current facility-administered medications on file prior to visit.    No Known Allergies  Social History   Socioeconomic History   Marital status: Single    Spouse name: Not on file   Number of children: 2   Years of education: 9   Highest education level: 9th grade  Occupational History   Occupation: house keeping  Tobacco Use   Smoking status: Never   Smokeless tobacco: Never  Vaping Use   Vaping Use: Never used  Substance and Sexual Activity   Alcohol use: No   Drug use: No   Sexual  activity: Yes    Birth control/protection: None  Other Topics Concern   Not on file  Social History Narrative   Not on file   Social Determinants of Health   Financial Resource Strain: Low Risk  (10/31/2022)   Overall Financial Resource Strain (CARDIA)    Difficulty of Paying Living Expenses: Not hard at all  Food Insecurity: No Food Insecurity (10/31/2022)   Hunger Vital Sign    Worried About Running Out of Food in the Last Year: Never true    Ran Out of Food in the Last Year: Never true  Transportation Needs: No Transportation Needs (10/31/2022)   PRAPARE -  Administrator, Civil Service (Medical): No    Lack of Transportation (Non-Medical): No  Physical Activity: Insufficiently Active (10/31/2022)   Exercise Vital Sign    Days of Exercise per Week: 3 days    Minutes of Exercise per Session: 40 min  Stress: No Stress Concern Present (10/31/2022)   [employer] of Occupational Health - Occupational Stress Questionnaire    Feeling of Stress : Not at all  Social Connections: Moderately Isolated (10/31/2022)   Social Connection and Isolation Panel [NHANES]    Frequency of Communication with Friends and Family: More than three times a week    Frequency of Social Gatherings with Friends and Family: Once a week    Attends Religious Services: Never    Database administrator or Organizations: No    Attends Engineer, structural: Not on file    Marital Status: Married  Catering manager Violence: Not on file    Family History  Problem Relation Age of Onset   Hypertension Mother    Colon polyps Mother    Diverticulitis Mother    Heart attack Mother    Anesthesia problems Neg Hx    Breast cancer Neg Hx    Colon cancer Neg Hx    Crohn's disease Neg Hx    Esophageal cancer Neg Hx    Rectal cancer Neg Hx    Stomach cancer Neg Hx    Ulcerative colitis Neg Hx     Past Surgical History:  Procedure Laterality Date   AUGMENTATION MAMMAPLASTY Bilateral 2011   saline implants   AUGMENTATION MAMMAPLASTY Bilateral 02/2018   explants     ROS: Review of Systems  Constitutional:  Negative for fever.  Respiratory:  Negative for shortness of breath.   Cardiovascular:  Positive for leg swelling.  Genitourinary:  Negative for pelvic pain, vaginal bleeding and vaginal pain.  Musculoskeletal:  Positive for back pain and joint swelling.    PHYSICAL EXAM: BP 130/82 (BP Location: Left Arm, Patient Position: Sitting, Cuff Size: Normal)   Pulse 66   Temp 98.2 F (36.8 C) (Oral)   Ht 5\' 4"  (1.626 m)   Wt 189 lb (85.7 kg)   SpO2  100%   BMI 32.44 kg/m    BP RECHECK 127/87  Wt Readings from Last 3 Encounters:  11/01/22 189 lb (85.7 kg)  08/23/22 192 lb 3.2 oz (87.2 kg)  08/09/22 192 lb (87.1 kg)    General appearance - alert, well appearing, middle aged Hispanic female and in no distress Mental status - alert, oriented to person, place, and time Chest - clear to auscultation, no wheezes, rales or rhonchi, symmetric air entry Heart - normal rate, regular rhythm, normal S1, S2, no murmurs, rubs, clicks or gallops Musculoskeletal -  Tenderness to palpation of shoulders, upper posterior back, and upper anterior chest. Positive  for b/l foot tenderness over Achilles tendon, mild swelling posterior b/l ankles, and pain with passive range of motion of b/l ankles.  Extremities - No edema in lower legs       Latest Ref Rng & Units 06/28/2022   11:29 AM 09/01/2020    8:48 AM 08/14/2020   11:29 AM  CMP  Glucose 70 - 99 mg/dL 87   86   BUN 6 - 24 mg/dL 17   12   Creatinine 0.98 - 1.00 mg/dL 1.19   1.47   Sodium 829 - 144 mmol/L 139   140   Potassium 3.5 - 5.2 mmol/L 5.2  4.4  5.3   Chloride 96 - 106 mmol/L 105   106   CO2 20 - 29 mmol/L 20   20   Calcium 8.7 - 10.2 mg/dL 56.2   9.5   Total Protein 6.0 - 8.5 g/dL 7.5   7.6   Total Bilirubin 0.0 - 1.2 mg/dL 0.3   0.5   Alkaline Phos 44 - 121 IU/L 101   103   AST 0 - 40 IU/L 21   22   ALT 0 - 32 IU/L 18   30    Lipid Panel     Component Value Date/Time   CHOL 248 (H) 06/28/2022 1129   TRIG 137 06/28/2022 1129   HDL 60 06/28/2022 1129   CHOLHDL 4.1 06/28/2022 1129   CHOLHDL 3.3 Ratio 09/25/2007 2030   VLDL 20 09/25/2007 2030   LDLCALC 163 (H) 06/28/2022 1129    CBC    Component Value Date/Time   WBC 6.0 06/28/2022 1129   WBC 9.8 07/24/2011 2155   RBC 4.46 06/28/2022 1129   RBC 4.02 07/24/2011 2155   HGB 13.5 06/28/2022 1129   HCT 40.0 06/28/2022 1129   PLT 330 06/28/2022 1129   MCV 90 06/28/2022 1129   MCH 30.3 06/28/2022 1129   MCH 31.1 07/24/2011  2155   MCHC 33.8 06/28/2022 1129   MCHC 34.2 07/24/2011 2155   RDW 13.1 06/28/2022 1129   LYMPHSABS 2.3 09/25/2007 2030   MONOABS 0.3 09/25/2007 2030   EOSABS 0.1 09/25/2007 2030   BASOSABS 0.0 09/25/2007 2030    ASSESSMENT AND PLAN:  Interpreter used during visit [employer] [id number]  1. Obesity (BMI 30.0-34.9) Patient advised to continue healthy diet and cutting back on portion sizes.   Advised to try water aerobics as exercise  Agreeable to seeing nutritionist referral submitted Advised on healthy diet   2. Achilles tendinitis of both lower extremities Referral to podiatry  Start Mobic 15 mg once a day  3. Postmenopausal bleeding Resolved.   4. Muscle spasm Start Robaxin 500mg  BID PRN patient advised that medication can cause drowsiness Start Mobic 15 mg once a day D/C Ibuprofen, Tylenol, Aleve and other OTC pain medications  5. Health Maintenance - MM Digital Screening; Future    Patient was given the opportunity to ask questions.  Patient verbalized understanding of the plan and was able to repeat key elements of the plan.   Orders Placed This Encounter  Procedures   MM Digital Screening   Ambulatory referral to Podiatry   Amb ref to Medical Nutrition Therapy-MNT     Requested Prescriptions   Signed Prescriptions Disp Refills   methocarbamol (ROBAXIN) 500 MG tablet 30 tablet 2    Sig: Take 1 tablet (500 mg total) by mouth 2 (two) times daily as needed for muscle spasms.   meloxicam (MOBIC) 15 MG tablet 30  tablet 0    Sig: Take 1 tablet (15 mg total) by mouth daily.    Return in about 4 months (around 03/04/2023).

## 2022-11-03 ENCOUNTER — Other Ambulatory Visit: Payer: Self-pay | Admitting: Internal Medicine

## 2022-11-03 DIAGNOSIS — N6489 Other specified disorders of breast: Secondary | ICD-10-CM

## 2022-11-08 ENCOUNTER — Ambulatory Visit
Admission: RE | Admit: 2022-11-08 | Discharge: 2022-11-08 | Disposition: A | Discharge status: Home / Self Care | Payer: Commercial Managed Care - PPO | Source: Ambulatory Visit | Attending: Internal Medicine | Admitting: Internal Medicine

## 2022-11-08 ENCOUNTER — Ambulatory Visit: Payer: Commercial Managed Care - PPO

## 2022-11-08 DIAGNOSIS — N6489 Other specified disorders of breast: Secondary | ICD-10-CM

## 2022-11-22 ENCOUNTER — Ambulatory Visit (INDEPENDENT_AMBULATORY_CARE_PROVIDER_SITE_OTHER): Payer: Commercial Managed Care - PPO

## 2022-11-22 ENCOUNTER — Ambulatory Visit (INDEPENDENT_AMBULATORY_CARE_PROVIDER_SITE_OTHER): Payer: Commercial Managed Care - PPO | Admitting: Podiatry

## 2022-11-22 ENCOUNTER — Encounter: Payer: Self-pay | Admitting: Podiatry

## 2022-11-22 DIAGNOSIS — M62838 Other muscle spasm: Secondary | ICD-10-CM

## 2022-11-22 DIAGNOSIS — M7662 Achilles tendinitis, left leg: Secondary | ICD-10-CM

## 2022-11-22 DIAGNOSIS — M7661 Achilles tendinitis, right leg: Secondary | ICD-10-CM

## 2022-11-22 MED ORDER — MELOXICAM 15 MG PO TABS
15.0000 mg | ORAL_TABLET | Freq: Every day | ORAL | 0 refills | Status: AC
Start: 2022-11-22 — End: ?

## 2022-11-22 NOTE — Patient Instructions (Signed)

## 2022-11-22 NOTE — Progress Notes (Signed)
  Subjective:  Patient ID: Yesenia Gibson, female    DOB: January 23, 1971,   MRN: [id number]  No chief complaint on file.   52 y.o. female presents for concern of bilateral heel pain that has been going on for several months. Relates burning in the back of her heel and pain radiating down from her knees. Does relates some history of low back pain. Denies any treatments. Here today with interpreter.  Denies any other pedal complaints. Denies n/v/f/c.   Past Medical History:  Diagnosis Date   Hyperlipidemia    No pertinent past medical history     Objective:  Physical Exam: Vascular: DP/PT pulses 2/4 bilateral. CFT <3 seconds. Normal hair growth on digits. No edema.  Skin. No lacerations or abrasions bilateral feet.  Musculoskeletal: MMT 5/5 bilateral lower extremities in DF, PF, Inversion and Eversion. Deceased ROM in DF of ankle joint. No tenderness today to palpation of the achilles bilateral but relates this is were the pain is. Relates some swelling.  Neurological: Sensation intact to light touch.   Assessment:   1. Achilles tendinitis of both lower extremities   2. Achilles tendinitis   3. Muscle spasm      Plan:  Patient was evaluated and treated and all questions answered. -Xrays reviewed. Spurring noted to posterior calcaneus bilateral.  -Discussed Achilles insertional tendonitis and treatment options with patient. Discussed possibility of nerve pain coming from back and knee as well.   -Discussed stretching exercises. -Rx Meloxicam refilled.  Most recent creatinine and BUN normal so feel ok to continue meloxicam.  -Heel lifts provided and discussed proper shoewear.  -Discussed if no improvement will consider MRI/PT/EPAT/PRP injections.  -Patient to return to office in 6 weeks for recheck.    Louann Sjogren, DPM

## 2023-01-03 ENCOUNTER — Ambulatory Visit (INDEPENDENT_AMBULATORY_CARE_PROVIDER_SITE_OTHER): Payer: Commercial Managed Care - PPO | Admitting: Podiatry

## 2023-01-03 DIAGNOSIS — Z91199 Patient's noncompliance with other medical treatment and regimen due to unspecified reason: Secondary | ICD-10-CM

## 2023-01-03 NOTE — Progress Notes (Signed)
No show

## 2023-03-07 ENCOUNTER — Encounter: Payer: Self-pay | Admitting: Internal Medicine

## 2023-03-07 ENCOUNTER — Ambulatory Visit: Payer: Commercial Managed Care - PPO | Attending: Internal Medicine | Admitting: Internal Medicine

## 2023-03-07 VITALS — BP 110/77 | HR 87 | Temp 98.2°F | Ht 64.0 in | Wt 180.0 lb

## 2023-03-07 DIAGNOSIS — Z23 Encounter for immunization: Secondary | ICD-10-CM

## 2023-03-07 DIAGNOSIS — E66811 Obesity, class 1: Secondary | ICD-10-CM | POA: Diagnosis not present

## 2023-03-07 DIAGNOSIS — Z2821 Immunization not carried out because of patient refusal: Secondary | ICD-10-CM | POA: Diagnosis not present

## 2023-03-07 NOTE — Progress Notes (Signed)
Patient ID: Yesenia Gibson, female    DOB: Mar 26, 1971  MRN: [id number]  CC: Obesity (Obesiy f/u. /No questions / concerns/Yes to shingles vax)   Subjective: Yesenia Gibson is a 52 y.o. female who presents for chronic ds management. Her concerns today include:  Hx  of obesity, Panic attacks, seasonal allergies, HL, obesity, perimenopausal, fibroids    Pt decline interpreter today stating she is practicing her english.   No bread, rice and sugary foods Started doing yoga and cardio classes 4 x a wk for 1 hr Sleeping well and job is going well Saw podiatry since last visit.  Foot pain resolved.  HM:  no to flu vaccine.  Yes to shingles vaccine Patient Active Problem List   Diagnosis Date Noted   Fibroids 08/09/2022   History of postmenopausal bleeding 08/09/2022   Menopause present 08/09/2022   Obesity (BMI 30.0-34.9) 08/14/2020   Perimenopausal 08/14/2020   COVID-19 vaccination declined 08/14/2020   Health care maintenance 09/28/2016   Seasonal allergic rhinitis due to pollen 09/07/2016   Anxiety disorder 09/07/2016     Current Outpatient Medications on File Prior to Visit  Medication Sig Dispense Refill   acetaminophen (TYLENOL) 500 MG tablet Take 500 mg by mouth every 6 (six) hours as needed. For pain     Cholecalciferol (VITAMIN D-3 PO) Take by mouth daily. ONE CAPSULE DAILY     ibuprofen (ADVIL) 400 MG tablet Take 400 mg by mouth as needed.     MAGNESIUM PO Take by mouth daily. TAKE ONE DAILY     Multiple Vitamins-Minerals (ZINC PO) Take by mouth daily. TAKE ONE DAILY     Omega-3 Fatty Acids (OMEGA-3 FISH OIL PO) Take by mouth daily. TAKE ONE DAILY     meloxicam (MOBIC) 15 MG tablet Take 1 tablet (15 mg total) by mouth daily. (Patient not taking: Reported on 03/07/2023) 30 tablet 0   methocarbamol (ROBAXIN) 500 MG tablet Take 1 tablet (500 mg total) by mouth 2 (two) times daily as needed for muscle spasms. (Patient not taking: Reported on 03/07/2023) 30 tablet 2   No  current facility-administered medications on file prior to visit.    No Known Allergies  Social History   Socioeconomic History   Marital status: Single    Spouse name: Not on file   Number of children: 2   Years of education: 9   Highest education level: 9th grade  Occupational History   Occupation: house keeping  Tobacco Use   Smoking status: Never   Smokeless tobacco: Never  Vaping Use   Vaping status: Never Used  Substance and Sexual Activity   Alcohol use: No   Drug use: No   Sexual activity: Yes    Birth control/protection: None  Other Topics Concern   Not on file  Social History Narrative   Not on file   Social Determinants of Health   Financial Resource Strain: Low Risk  (03/04/2023)   Overall Financial Resource Strain (CARDIA)    Difficulty of Paying Living Expenses: Not hard at all  Food Insecurity: No Food Insecurity (03/04/2023)   Hunger Vital Sign    Worried About Running Out of Food in the Last Year: Never true    Ran Out of Food in the Last Year: Never true  Transportation Needs: No Transportation Needs (03/04/2023)   PRAPARE - Administrator, Civil Service (Medical): No    Lack of Transportation (Non-Medical): No  Physical Activity: Sufficiently Active (03/04/2023)   Exercise Vital  Sign    Days of Exercise per Week: 4 days    Minutes of Exercise per Session: 60 min  Stress: No Stress Concern Present (03/04/2023)   [employer] of Occupational Health - Occupational Stress Questionnaire    Feeling of Stress : Not at all  Social Connections: Moderately Isolated (03/04/2023)   Social Connection and Isolation Panel [NHANES]    Frequency of Communication with Friends and Family: Twice a week    Frequency of Social Gatherings with Friends and Family: Twice a week    Attends Religious Services: Never    Diplomatic Services operational officer: No    Attends Engineer, structural: Not on file    Marital Status: Married  Careers information officer Violence: Not on file    Family History  Problem Relation Age of Onset   Hypertension Mother    Colon polyps Mother    Diverticulitis Mother    Heart attack Mother    Anesthesia problems Neg Hx    Breast cancer Neg Hx    Colon cancer Neg Hx    Crohn's disease Neg Hx    Esophageal cancer Neg Hx    Rectal cancer Neg Hx    Stomach cancer Neg Hx    Ulcerative colitis Neg Hx     Past Surgical History:  Procedure Laterality Date   AUGMENTATION MAMMAPLASTY Bilateral 2011   saline implants   AUGMENTATION MAMMAPLASTY Bilateral 02/2018   explants     ROS: Review of Systems Negative except as stated above  PHYSICAL EXAM: BP 110/77 (BP Location: Left Arm, Patient Position: Sitting, Cuff Size: Normal)   Pulse 87   Temp 98.2 F (36.8 C) (Oral)   Ht 5\' 4"  (1.626 m)   Wt 180 lb (81.6 kg)   SpO2 100%   BMI 30.90 kg/m   Wt Readings from Last 3 Encounters:  03/07/23 180 lb (81.6 kg)  11/01/22 189 lb (85.7 kg)  08/23/22 192 lb 3.2 oz (87.2 kg)    Physical Exam  General appearance - alert, well appearing, and in no distress Mental status - normal mood, behavior, speech, dress, motor activity, and thought processes Neck - supple, no significant adenopathy Chest - clear to auscultation, no wheezes, rales or rhonchi, symmetric air entry Heart - normal rate, regular rhythm, normal S1, S2, no murmurs, rubs, clicks or gallops Extremities - peripheral pulses normal, no pedal edema, no clubbing or cyanosis      Latest Ref Rng & Units 06/28/2022   11:29 AM 09/01/2020    8:48 AM 08/14/2020   11:29 AM  CMP  Glucose 70 - 99 mg/dL 87   86   BUN 6 - 24 mg/dL 17   12   Creatinine 4.09 - 1.00 mg/dL 8.11   9.14   Sodium 782 - 144 mmol/L 139   140   Potassium 3.5 - 5.2 mmol/L 5.2  4.4  5.3   Chloride 96 - 106 mmol/L 105   106   CO2 20 - 29 mmol/L 20   20   Calcium 8.7 - 10.2 mg/dL 95.6   9.5   Total Protein 6.0 - 8.5 g/dL 7.5   7.6   Total Bilirubin 0.0 - 1.2 mg/dL 0.3   0.5    Alkaline Phos 44 - 121 IU/L 101   103   AST 0 - 40 IU/L 21   22   ALT 0 - 32 IU/L 18   30    Lipid Panel  Component Value Date/Time   CHOL 248 (H) 06/28/2022 1129   TRIG 137 06/28/2022 1129   HDL 60 06/28/2022 1129   CHOLHDL 4.1 06/28/2022 1129   CHOLHDL 3.3 Ratio 09/25/2007 2030   VLDL 20 09/25/2007 2030   LDLCALC 163 (H) 06/28/2022 1129    CBC    Component Value Date/Time   WBC 6.0 06/28/2022 1129   WBC 9.8 07/24/2011 2155   RBC 4.46 06/28/2022 1129   RBC 4.02 07/24/2011 2155   HGB 13.5 06/28/2022 1129   HCT 40.0 06/28/2022 1129   PLT 330 06/28/2022 1129   MCV 90 06/28/2022 1129   MCH 30.3 06/28/2022 1129   MCH 31.1 07/24/2011 2155   MCHC 33.8 06/28/2022 1129   MCHC 34.2 07/24/2011 2155   RDW 13.1 06/28/2022 1129   LYMPHSABS 2.3 09/25/2007 2030   MONOABS 0.3 09/25/2007 2030   EOSABS 0.1 09/25/2007 2030   BASOSABS 0.0 09/25/2007 2030    ASSESSMENT AND PLAN: 1. Obesity (BMI 30.0-34.9) Down 12 lbs since April.  Commended on this.  Encouraged to continue healthy eating and regular exercise  2. Need for shingles vaccine Given today; advise can cause some soreness and redness at inj site RN visit in 3 mths for 2nd.  3. Influenza vaccination declined   Patient was given the opportunity to ask questions.  Patient verbalized understanding of the plan and was able to repeat key elements of the plan.   This documentation was completed using Paediatric nurse.  Any transcriptional errors are unintentional.  No orders of the defined types were placed in this encounter.    Requested Prescriptions    No prescriptions requested or ordered in this encounter    No follow-ups on file.  Jonah Blue, MD, FACP

## 2023-03-07 NOTE — Patient Instructions (Signed)
Alimentacin saludable en los adultos Healthy Eating, Adult Una alimentacin saludable puede ayudarlo a alcanzar y mantener un peso saludable, reducir el riesgo de tener enfermedades crnicas y vivir una vida larga y productiva. Es importante que siga una modalidad de alimentacin saludable. Sus necesidades nutricionales y calricas deben satisfacerse principalmente con distintos alimentos ricos en nutrientes. Consejos para seguir este plan Lea las etiquetas de los alimentos Lea las etiquetas y elija las que digan lo siguiente: Productos reducidos en sodio o con bajo contenido de sodio. Jugos con 100 % jugo de fruta. Alimentos con bajo contenido de grasas saturadas (menos de 3 g por porcin) y alto contenido de grasas poliinsaturadas y monoinsaturadas. Alimentos con cereales integrales, como trigo integral, trigo partido, arroz integral y arroz salvaje. Cereales integrales fortificados con cido flico. Esto se recomienda a las mujeres embarazadas o que desean quedar embarazadas. Lea las etiquetas y no coma ni beba lo siguiente: Alimentos o bebidas con azcar agregada. Estos incluyen los alimentos que contienen azcar moreno, endulzante a base de maz, jarabe de maz, dextrosa, fructosa, glucosa, jarabe de maz de alta fructosa, miel, azcar invertido, lactosa, jarabe de malta, maltosa, melaza, azcar sin refinar, sacarosa, trehalosa y azcar turbinado. Limite el consumo de azcar agregada a menos del 10 % del total de caloras diarias. No consuma ms que las siguientes cantidades de azcar agregada por da: 6 cucharaditas (25 g) para las mujeres. 9 cucharaditas (38 g) para los hombres. Los alimentos que contienen almidones y cereales refinados o procesados. Los productos de cereales refinados, como harina blanca, harina de maz desgerminada, pan blanco y arroz blanco. Al ir de compras Elija refrigerios ricos en nutrientes, como verduras, frutas enteras y frutos secos. Evite los refrigerios con  alto contenido de caloras y azcar, como las papas fritas, los refrigerios frutales y los caramelos. Use alios y productos para untar a base de aceite con los alimentos en lugar de grasas slidas como la mantequilla, la margarina, la crema agria o el queso crema. Limite las salsas, las mezclas y los productos "instantneos" preelaborados como el arroz saborizado, los fideos instantneos y las pastas listas para comer. Pruebe ms fuentes de protena vegetal, como tofu, tempeh, frijoles negros, edamame, lentejas, frutos secos y semillas. Explore planes de alimentacin como la dieta mediterrnea o la dieta vegetariana. Pruebe salsas cardiosaludables hechas con frijoles y grasas saludables, como hummus y guacamole. Las verduras van muy bien con ellas. Al cocinar Use aceite para saltear los alimentos en lugar de grasas slidas como mantequilla, margarina o manteca de cerdo. En lugar de frer, trate de cocinar en el horno, en la plancha o en la parrilla, o hervir los alimentos. Retire la parte grasa de las carnes antes de cocinarlas. Cocine las verduras al vapor en agua o caldo. Planificacin de las comidas  En las comidas, imagine dividir su plato en cuartos: La mitad del plato tiene frutas y verduras. Un cuarto del plato tiene cereales integrales. Un cuarto del plato tiene protena, especialmente carnes magras, aves, huevos, tofu, frijoles o frutos secos. Incluya lcteos descremados en su dieta diaria. Estilo de vida Elija opciones saludables en todos los mbitos, como en el hogar, el trabajo, la escuela, los restaurantes y las tiendas. Prepare los alimentos de un modo seguro: Lvese las manos despus de manipular carnes crudas. Donde prepare alimentos, mantenga las superficies limpias lavndolas regularmente con agua caliente y jabn. Mantenga las carnes crudas separadas de los alimentos que estn listos para comer como las frutas y las verduras. Cocine los frutos   de mar, carnes, aves y huevos  hasta alcanzar la temperatura recomendada. Consiga un termmetro para alimentos. Almacene los alimentos a temperaturas seguras. En general: Mantenga los alimentos fros a una temperatura de 40 F (4,4 C) o inferior. Mantenga los alimentos calientes a una temperatura de 140 F (60 C) o superior. Mantenga el congelador a una temperatura de 0 F (-17,8 C) o inferior. Los alimentos no son seguros para su consumo cuando han estado a una temperatura de entre 40 y 140 F (4.4 y 60 C) por ms de 2 horas. Qu alimentos debo comer? Frutas Propngase comer entre 1 y 2 tazas de frutas frescas, enlatadas (en su jugo natural) o congeladas cada da. Una taza de fruta equivale a 1 manzana pequea, 1 banana grande, 8 fresas grandes, 1 taza (237 g) de fruta enlatada,  taza (82 g) de fruta seca o 1 taza (240 ml) de jugo al 100 %. Verduras Propngase comer de 2 a 4 tazas de verduras frescas y congeladas cada da, incluyendo diferentes variedades y colores. Una taza de verduras equivale a 1 taza (91 g) de brcoli o coliflor, 2 zanahorias medianas, 2 tazas (150 g) de verduras de hoja verde crudas, 1 tomate grande, 1 pimiento morrn grande, 1 batata grande o 1 patata blanca mediana. Cereales Propngase comer el equivalente a entre 4 y 10 onzas de cereales integrales por da. Algunos ejemplos de equivalentes a 1 onza de cereales son 1 rebanada de pan, 1 taza (40 g) de cereal listo para comer, 3 tazas (24 g) de palomitas de maz o  taza (93 g) de arroz cocido. Carnes y otras protenas Propngase comer el equivalente a entre 5 y 7  onzas de protena por da. Algunos ejemplos de equivalentes a 1 onza de protenas incluyen 1 huevo,  oz de frutos secos (12 almendras, 24 pistachos o 7 mitades de nueces), 1/4 taza (90 g) de frijoles cocidos, 6 cucharadas (90 g) de hummus o 1 cucharada (16 g) de mantequilla de man. Un corte de carne o pescado del tamao de un mazo de cartas equivale aproximadamente a 3 a 4 onzas (85  g). De las protenas que consume cada semana, intente que al menos 8 onzas (227 g) sean frutos de mar. Esto equivale a unas 2 porciones por semana. Esto incluye salmn, trucha, arenque y anchoas. Lcteos Propngase comer el equivalente a 3 tazas de lcteos descremados o con bajo contenido de grasa cada da. Algunos ejemplos de equivalentes a 1 taza de lcteos son 1 taza (240 ml) de leche, 8 onzas (250 g) de yogur, 1 onzas (44 g) de queso natural o 1 taza (240 ml) de leche de soja fortificada. Grasas y aceites Propngase consumir alrededor de 5 cucharaditas (21 g) de grasas y aceites por da. Elija grasas monoinsaturadas, como el aceite de canola y de oliva, la mayonesa hecha con aceite de oliva o de aguacate, aguacates, mantequilla de man y la mayora de los frutos secos, o bien grasas poliinsaturadas, como el aceite de girasol, maz y soja, nueces, piones, semillas de ssamo, semillas de girasol y semillas de lino. Bebidas Propngase beber 6 vasos de 8 onzas de agua por da. Limite el caf a entre 3 y 5 tazas de ocho onzas por da. Limite el consumo de bebidas con cafena que tengan caloras agregadas, como los refrescos y las bebidas energizantes. Si bebe alcohol: Limite la cantidad que bebe a lo siguiente: De 0 a 1 medida al da si es mujer. De 0 a 2 medidas   al da si es varn. Sepa cunta cantidad de alcohol hay en las bebidas que toma. En los [place], una medida es una botella de cerveza de 12 oz (355 ml), un vaso de vino de 5 oz (148 ml) o un vaso de una bebida alcohlica de alta graduacin de 1 oz (44 ml). Condimentos y otros alimentos Trate de no agregar demasiada sal a los alimentos. Trate de usar hierbas y especias en lugar de sal. Trate de no agregar azcar a los alimentos. Esta informacin se basa en las pautas de nutricin de los [place] Para obtener ms informacin, visite choosemyplate.gov. Las cantidades exactas pueden variar. Es posible que necesite cantidades  diferentes. Esta informacin no tiene como fin reemplazar el consejo del mdico. Asegrese de hacerle al mdico cualquier pregunta que tenga. Document Revised: 02/09/2022 Document Reviewed: 02/09/2022 Elsevier Patient Education  2024 Elsevier Inc.  

## 2023-06-13 ENCOUNTER — Ambulatory Visit: Payer: Commercial Managed Care - PPO | Attending: Nurse Practitioner

## 2023-06-13 DIAGNOSIS — Z23 Encounter for immunization: Secondary | ICD-10-CM | POA: Diagnosis not present

## 2023-06-13 NOTE — Progress Notes (Signed)
 Shingles vaccine administered in right deltoid per protocols.  Information sheet given. Patient denies and pain or discomfort at injection site. Tolerated injection well no reaction.

## 2023-09-05 ENCOUNTER — Ambulatory Visit: Payer: Self-pay | Admitting: Internal Medicine

## 2023-10-31 ENCOUNTER — Other Ambulatory Visit: Payer: Self-pay | Admitting: Internal Medicine

## 2023-10-31 DIAGNOSIS — Z0000 Encounter for general adult medical examination without abnormal findings: Secondary | ICD-10-CM

## 2023-11-14 ENCOUNTER — Ambulatory Visit
Admission: RE | Admit: 2023-11-14 | Discharge: 2023-11-14 | Disposition: A | Discharge status: Home / Self Care | Payer: Self-pay | Source: Ambulatory Visit | Attending: Internal Medicine | Admitting: Internal Medicine

## 2023-11-14 DIAGNOSIS — Z0000 Encounter for general adult medical examination without abnormal findings: Secondary | ICD-10-CM

## 2023-11-16 ENCOUNTER — Ambulatory Visit: Payer: Self-pay | Admitting: Internal Medicine

## 2023-11-17 ENCOUNTER — Other Ambulatory Visit: Payer: Self-pay | Admitting: Internal Medicine

## 2023-11-17 DIAGNOSIS — R928 Other abnormal and inconclusive findings on diagnostic imaging of breast: Secondary | ICD-10-CM

## 2023-12-12 ENCOUNTER — Ambulatory Visit
Admission: RE | Admit: 2023-12-12 | Discharge: 2023-12-12 | Disposition: A | Discharge status: Home / Self Care | Source: Ambulatory Visit | Attending: Internal Medicine | Admitting: Internal Medicine

## 2023-12-12 ENCOUNTER — Ambulatory Visit: Payer: Self-pay | Admitting: Internal Medicine

## 2023-12-12 DIAGNOSIS — R928 Other abnormal and inconclusive findings on diagnostic imaging of breast: Secondary | ICD-10-CM
